# Patient Record
Sex: Male | Born: 1960 | Race: Asian | Hispanic: No | Marital: Married | Smoking: Former smoker
Health system: Southern US, Community
[De-identification: ages and names within clinical notes are randomized; demographics above are authoritative.]

## PROBLEM LIST (undated history)

## (undated) DIAGNOSIS — C801 Malignant (primary) neoplasm, unspecified: Secondary | ICD-10-CM

## (undated) HISTORY — PX: UPPER GI ENDOSCOPY: SHX6162

---

## 2008-10-21 ENCOUNTER — Emergency Department (HOSPITAL_COMMUNITY): Admission: EM | Admit: 2008-10-21 | Discharge: 2008-10-21 | Payer: Self-pay | Admitting: Emergency Medicine

## 2010-11-28 LAB — CBC
HCT: 42.1 % (ref 39.0–52.0)
Hemoglobin: 14.5 g/dL (ref 13.0–17.0)
MCHC: 34.4 g/dL (ref 30.0–36.0)
MCV: 101.6 fL — ABNORMAL HIGH (ref 78.0–100.0)
Platelets: 386 K/uL (ref 150–400)
RBC: 4.15 MIL/uL — ABNORMAL LOW (ref 4.22–5.81)
RDW: 13.2 % (ref 11.5–15.5)
WBC: 16.5 K/uL — ABNORMAL HIGH (ref 4.0–10.5)

## 2010-11-28 LAB — DIFFERENTIAL
Basophils Absolute: 0 10*3/uL (ref 0.0–0.1)
Eosinophils Absolute: 0.1 10*3/uL (ref 0.0–0.7)
Eosinophils Relative: 1 % (ref 0–5)
Lymphocytes Relative: 12 % (ref 12–46)
Neutrophils Relative %: 85 % — ABNORMAL HIGH (ref 43–77)

## 2010-11-28 LAB — URINALYSIS, ROUTINE W REFLEX MICROSCOPIC
Bilirubin Urine: NEGATIVE
Glucose, UA: NEGATIVE mg/dL
Ketones, ur: NEGATIVE mg/dL
Leukocytes, UA: NEGATIVE
Nitrite: NEGATIVE
Protein, ur: NEGATIVE mg/dL
Specific Gravity, Urine: 1.012 (ref 1.005–1.030)
Urobilinogen, UA: 0.2 mg/dL (ref 0.0–1.0)
pH: 7 (ref 5.0–8.0)

## 2010-11-28 LAB — URINE MICROSCOPIC-ADD ON

## 2010-11-28 LAB — BASIC METABOLIC PANEL
BUN: 10 mg/dL (ref 6–23)
Creatinine, Ser: 0.99 mg/dL (ref 0.4–1.5)
GFR calc non Af Amer: >60 mL/min (ref >60)
Glucose, Bld: 117 mg/dL — ABNORMAL HIGH (ref 70–99)
Potassium: 4 mEq/L (ref 3.5–5.1)

## 2012-11-22 ENCOUNTER — Ambulatory Visit: Payer: BC Managed Care – PPO | Admitting: Family Medicine

## 2012-11-22 VITALS — BP 108/72 | HR 78 | Temp 98.0°F | Resp 16 | Ht 63.5 in | Wt 105.0 lb

## 2012-11-22 DIAGNOSIS — R1013 Epigastric pain: Secondary | ICD-10-CM

## 2012-11-22 DIAGNOSIS — K2970 Gastritis, unspecified, without bleeding: Secondary | ICD-10-CM

## 2012-11-22 DIAGNOSIS — R109 Unspecified abdominal pain: Secondary | ICD-10-CM

## 2012-11-22 DIAGNOSIS — K265 Chronic or unspecified duodenal ulcer with perforation: Secondary | ICD-10-CM

## 2012-11-22 MED ORDER — ESOMEPRAZOLE MAGNESIUM 40 MG PO CPDR: 40.0000 mg | DELAYED_RELEASE_CAPSULE | Freq: Every day | ORAL | Status: DC

## 2012-11-22 NOTE — Patient Instructions (Addendum)
Schedule an appointment or come back to our walk-in clinic for your full physical and we will do all of your blood work and see if the medication is working.  Please fast at that visit.  Gastritis, Adult Gastritis is soreness and swelling (inflammation) of the lining of the stomach. Gastritis can develop as a sudden onset (acute) or long-term (chronic) condition. If gastritis is not treated, it can lead to stomach bleeding and ulcers. CAUSES  Gastritis occurs when the stomach lining is weak or damaged. Digestive juices from the stomach then inflame the weakened stomach lining. The stomach lining may be weak or damaged due to viral or bacterial infections. One common bacterial infection is the Helicobacter pylori infection. Gastritis can also result from excessive alcohol consumption, taking certain medicines, or having too much acid in the stomach.  SYMPTOMS  In some cases, there are no symptoms. When symptoms are present, they may include:  Pain or a burning sensation in the upper abdomen.  Nausea.  Vomiting.  An uncomfortable feeling of fullness after eating. DIAGNOSIS  Your caregiver may suspect you have gastritis based on your symptoms and a physical exam. To determine the cause of your gastritis, your caregiver may perform the following:  Blood or stool tests to check for the H pylori bacterium.  Gastroscopy. A thin, flexible tube (endoscope) is passed down the esophagus and into the stomach. The endoscope has a light and camera on the end. Your caregiver uses the endoscope to view the inside of the stomach.  Taking a tissue sample (biopsy) from the stomach to examine under a microscope. TREATMENT  Depending on the cause of your gastritis, medicines may be prescribed. If you have a bacterial infection, such as an H pylori infection, antibiotics may be given. If your gastritis is caused by too much acid in the stomach, H2 blockers or antacids may be given. Your caregiver may recommend  that you stop taking aspirin, ibuprofen, or other nonsteroidal anti-inflammatory drugs (NSAIDs). HOME CARE INSTRUCTIONS  Only take over-the-counter or prescription medicines as directed by your caregiver.  If you were given antibiotic medicines, take them as directed. Finish them even if you start to feel better.  Drink enough fluids to keep your urine clear or pale yellow.  Avoid foods and drinks that make your symptoms worse, such as:  Caffeine or alcoholic drinks.  Chocolate.  Peppermint or mint flavorings.  Garlic and onions.  Spicy foods.  Citrus fruits, such as oranges, lemons, or limes.  Tomato-based foods such as sauce, chili, salsa, and pizza.  Fried and fatty foods.  Eat small, frequent meals instead of large meals. SEEK IMMEDIATE MEDICAL CARE IF:   You have black or dark red stools.  You vomit blood or material that looks like coffee grounds.  You are unable to keep fluids down.  Your abdominal pain gets worse.  You have a fever.  You do not feel better after 1 week.  You have any other questions or concerns. MAKE SURE YOU:  Understand these instructions.  Will watch your condition.  Will get help right away if you are not doing well or get worse. Document Released: 07/29/2001 Document Revised: 02/03/2012 Document Reviewed: 09/17/2011 [facility] Patient Information 2013 [place], [place].

## 2012-11-22 NOTE — Progress Notes (Signed)
  Subjective:    Patient ID: Evan Peterson, male    DOB: Jan 08, 1961, 52 y.o.   MRN: [id number]  HPI  Has been having epigastric pain for the past several months with cramping. Does have bowel movements 1-3x daily but normal consistency.  Normal urination.  Has tried some otc meds - several types and some helped but doesn't remember what it was. Has tried zantac and pepcid with some minimal relief. No burping, occ flatulence.  Starts often when he is starting to get hungry and goes away with food.  Sometimes worse with spicy food.  Does not know about greasy foods. No melena or blood in stools. No n/v.  History reviewed. No pertinent past medical history.  No current outpatient prescriptions on file prior to visit.   No current facility-administered medications on file prior to visit.    Allergies  Allergen Reactions  . Amoxicillin   . Asa (Aspirin) Nausea And Vomiting   .  Review of Systems  Constitutional: Positive for appetite change. Negative for fever, chills, diaphoresis and activity change.  Respiratory: Negative for shortness of breath.   Cardiovascular: Negative for chest pain.  Gastrointestinal: Positive for abdominal pain. Negative for nausea, vomiting, diarrhea, constipation, blood in stool, abdominal distention, anal bleeding and rectal pain.  Genitourinary: Negative for dysuria, frequency and decreased urine volume.  Hematological: Negative for adenopathy.      BP 108/72  Pulse 78  Temp(Src) 98 F (36.7 C) (Oral)  Resp 16  Ht 5' 3.5" (1.613 m)  Wt 105 lb (47.628 kg)  BMI 18.31 kg/m2  SpO2 97% Objective:   Physical Exam  Constitutional: He appears well-developed and well-nourished. No distress.  HENT:  Head: Normocephalic and atraumatic.  Neck: Normal range of motion. Neck supple. No thyromegaly present.  Cardiovascular: Normal rate, regular rhythm and normal heart sounds.   Pulmonary/Chest: Effort normal and breath sounds normal.  Abdominal: Soft. Normal  appearance and bowel sounds are normal. He exhibits no distension and no mass. There is no tenderness. There is no rebound, no guarding and no CVA tenderness. No hernia.  Genitourinary: Rectum normal and prostate normal. Rectal exam shows no tenderness and anal tone normal. Guaiac negative stool.  Lymphadenopathy:    He has no cervical adenopathy.  Skin: He is not diaphoretic.          Assessment & Plan:  Abdominal pain - Plan: CANCELED: POCT CBC, CANCELED: Comprehensive metabolic panel, CANCELED: H. pylori antibody, IgG  Abdominal pain, chronic, epigastric  Gastritis  Pt declines labs today. He would like to try the rx nexium first to see if it works and then he will RTC for a full CPE - ok to sched at 104 if pt prefers - and do labs at that time. If he is still having any abd sxs, cons doing h. pylori at f/u.

## 2013-02-22 ENCOUNTER — Telehealth: Payer: Self-pay | Admitting: Family Medicine

## 2013-02-22 NOTE — Telephone Encounter (Signed)
Nexium is not really helping so wanted to change to a different medication. Please advise  [facility] [place] [phone]

## 2013-02-23 NOTE — Telephone Encounter (Signed)
Called patient to advise. Voice mail not set up yet. 

## 2013-02-23 NOTE — Telephone Encounter (Signed)
Per my note in April "Pt declines labs today. He would like to try the rx nexium first to see if it works and then he will RTC for a full CPE - ok to sched at 104 if pt prefers - and do labs at that time. If he is still having any abd sxs, he will need a h. pylori test at f/u." So he need to come in for labs.  If he wants to do a full CPE at the same time so he only has to do blood work once, that is fine but may have 2 co-pays (problem visit and CPE visit). If in for CPE visit - needs to be fasting.

## 2013-02-23 NOTE — Telephone Encounter (Signed)
Patient called back, declined appt. States waste of money to come in for labs, explained again. He states he will follow up with family dr.

## 2013-06-16 ENCOUNTER — Other Ambulatory Visit: Payer: Self-pay | Admitting: Gastroenterology

## 2013-06-16 DIAGNOSIS — R1012 Left upper quadrant pain: Secondary | ICD-10-CM

## 2013-06-17 ENCOUNTER — Ambulatory Visit
Admission: RE | Admit: 2013-06-17 | Discharge: 2013-06-17 | Disposition: A | Discharge status: Home / Self Care | Payer: Self-pay | Source: Ambulatory Visit | Attending: Gastroenterology | Admitting: Gastroenterology

## 2013-06-17 DIAGNOSIS — R1012 Left upper quadrant pain: Secondary | ICD-10-CM

## 2013-07-12 ENCOUNTER — Other Ambulatory Visit: Payer: Self-pay | Admitting: Gastroenterology

## 2013-07-18 ENCOUNTER — Encounter (HOSPITAL_COMMUNITY): Payer: Self-pay | Admitting: Emergency Medicine

## 2013-07-18 ENCOUNTER — Telehealth: Payer: Self-pay | Admitting: *Deleted

## 2013-07-18 ENCOUNTER — Emergency Department (HOSPITAL_COMMUNITY)
Admission: EM | Admit: 2013-07-18 | Discharge: 2013-07-18 | Disposition: A | Discharge status: Home / Self Care | Payer: BC Managed Care – PPO | Attending: Emergency Medicine | Admitting: Emergency Medicine

## 2013-07-18 ENCOUNTER — Emergency Department (HOSPITAL_COMMUNITY): Payer: BC Managed Care – PPO

## 2013-07-18 DIAGNOSIS — R1013 Epigastric pain: Secondary | ICD-10-CM | POA: Insufficient documentation

## 2013-07-18 DIAGNOSIS — R112 Nausea with vomiting, unspecified: Secondary | ICD-10-CM

## 2013-07-18 DIAGNOSIS — R109 Unspecified abdominal pain: Secondary | ICD-10-CM

## 2013-07-18 DIAGNOSIS — Z79899 Other long term (current) drug therapy: Secondary | ICD-10-CM | POA: Insufficient documentation

## 2013-07-18 DIAGNOSIS — Z792 Long term (current) use of antibiotics: Secondary | ICD-10-CM | POA: Insufficient documentation

## 2013-07-18 DIAGNOSIS — F17200 Nicotine dependence, unspecified, uncomplicated: Secondary | ICD-10-CM | POA: Insufficient documentation

## 2013-07-18 DIAGNOSIS — C169 Malignant neoplasm of stomach, unspecified: Secondary | ICD-10-CM | POA: Insufficient documentation

## 2013-07-18 HISTORY — DX: Malignant (primary) neoplasm, unspecified: C80.1

## 2013-07-18 LAB — CBC WITH DIFFERENTIAL/PLATELET
Basophils Absolute: 0 10*3/uL (ref 0.0–0.1)
Basophils Relative: 0 % (ref 0–1)
Eosinophils Absolute: 0.7 10*3/uL (ref 0.0–0.7)
Eosinophils Relative: 7 % — ABNORMAL HIGH (ref 0–5)
HCT: 42.6 % (ref 39.0–52.0)
Hemoglobin: 15.3 g/dL (ref 13.0–17.0)
Lymphocytes Relative: 32 % (ref 12–46)
Lymphs Abs: 3.2 10*3/uL (ref 0.7–4.0)
MCH: 34.1 pg — ABNORMAL HIGH (ref 26.0–34.0)
MCHC: 35.9 g/dL (ref 30.0–36.0)
MCV: 94.9 fL (ref 78.0–100.0)
Monocytes Absolute: 0.7 10*3/uL (ref 0.1–1.0)
Monocytes Relative: 8 % (ref 3–12)
Neutro Abs: 5.2 10*3/uL (ref 1.7–7.7)
Neutrophils Relative %: 53 % (ref 43–77)
Platelets: 477 10*3/uL — ABNORMAL HIGH (ref 150–400)
RBC: 4.49 MIL/uL (ref 4.22–5.81)
RDW: 13 % (ref 11.5–15.5)
WBC: 9.8 10*3/uL (ref 4.0–10.5)

## 2013-07-18 LAB — URINALYSIS, ROUTINE W REFLEX MICROSCOPIC
Glucose, UA: NEGATIVE mg/dL
Leukocytes, UA: NEGATIVE
Protein, ur: NEGATIVE mg/dL
Urobilinogen, UA: 0.2 mg/dL (ref 0.0–1.0)

## 2013-07-18 LAB — LIPASE, BLOOD: Lipase: 33 U/L (ref 11–59)

## 2013-07-18 LAB — COMPREHENSIVE METABOLIC PANEL
Albumin: 3.6 g/dL (ref 3.5–5.2)
BUN: 10 mg/dL (ref 6–23)
Chloride: 99 mEq/L (ref 96–112)
Creatinine, Ser: 0.7 mg/dL (ref 0.50–1.35)
Total Bilirubin: 0.2 mg/dL — ABNORMAL LOW (ref 0.3–1.2)

## 2013-07-18 LAB — URINE MICROSCOPIC-ADD ON

## 2013-07-18 MED ORDER — HYDROMORPHONE HCL PF 1 MG/ML IJ SOLN
1.0000 mg | Freq: Once | INTRAMUSCULAR | Status: AC
  Administered 2013-07-18: 1 mg via INTRAVENOUS
  Filled 2013-07-18: qty 1

## 2013-07-18 MED ORDER — ONDANSETRON HCL 4 MG/2ML IJ SOLN
4.0000 mg | Freq: Once | INTRAMUSCULAR | Status: AC
  Administered 2013-07-18: 4 mg via INTRAVENOUS
  Filled 2013-07-18: qty 2

## 2013-07-18 MED ORDER — SODIUM CHLORIDE 0.9 % IV BOLUS (SEPSIS)
1000.0000 mL | Freq: Once | INTRAVENOUS | Status: AC
  Administered 2013-07-18: 1000 mL via INTRAVENOUS

## 2013-07-18 MED ORDER — ONDANSETRON HCL 4 MG PO TABS: 4.0000 mg | ORAL_TABLET | Freq: Four times a day (QID) | ORAL | Status: DC

## 2013-07-18 MED ORDER — OXYCODONE-ACETAMINOPHEN 5-325 MG PO TABS: 1.0000 | ORAL_TABLET | Freq: Four times a day (QID) | ORAL | Status: DC | PRN

## 2013-07-18 MED ORDER — IOHEXOL 300 MG/ML  SOLN: 50.0000 mL | Freq: Once | INTRAMUSCULAR | Status: DC | PRN

## 2013-07-18 NOTE — Telephone Encounter (Signed)
Received referral from Dr. Bosie Clos on 07/15/13.  Information was reviewed by Dr. Truett Perna.  Per Dr. Truett Perna, needs to see Dr. Donell Beers first.  This RN left message with Dr. Arita Miss nurse to see if patient can be seen by Dr.Byerly this week.  This RN spoke with Dr. Marge Duncans nurse, Efraim Kaufmann, and notified her of above information.  Efraim Kaufmann is also going to contact Dr.Byerly's office and will fax the patient's information.

## 2013-07-18 NOTE — ED Notes (Signed)
Pt sent from [facility], Dr Bosie Clos. Pt had endoscopy last week, dx with stomach cancer. Suppose to see surgeon Dr Donell Beers surgeon on Friday. Pt reported pain is worse, vomits food, but can keep fluids down.

## 2013-07-18 NOTE — Telephone Encounter (Signed)
Received phone call from Dr. Marge Duncans nurse, Efraim Kaufmann.  She stated that she called patient to give appointment with Dr. Donell Beers for 07/22/13.  She said the patient stated he cannot wait until Friday due to having difficulty eating, having pain, and throwing up.  She stated patient was instructed to go to the ED  ASAP per Dr. Bosie Clos.   Dr. Truett Perna  And Dr. Donell Beers informed of above.

## 2013-07-18 NOTE — ED Provider Notes (Signed)
CSN: [id number]     Arrival date & time 07/18/13  1322 History   First MD Initiated Contact with Patient 07/18/13 1536     Chief Complaint  Patient presents with  . stomach cancer, sent from [facility]    (Consider location/radiation/quality/duration/timing/severity/associated sxs/prior Treatment) HPI  This a 52 year old male with stomach cancer who presents with nausea, vomiting, inability to tolerate by mouth, and abdominal pain.  Patient had an endoscopy last week and was diagnosed with cancer. He is scheduled to see Dr. Donell Beers on Friday. Patient reports a 2 to three-week history of nausea, vomiting, and inability to tolerate by mouth. He scrapped nonbilious, nonbloody emesis. He also reports sharp epigastric pain that is nonradiating. Currently his pain is 9/10. He reports that his pain medications at home are not helping. He states he feels fatigued and generally weak. He denies any focal weakness or numbness. He denies any fevers.  Past Medical History  Diagnosis Date  . Cancer     stomach   Past Surgical History  Procedure Laterality Date  . Upper gi endoscopy     History reviewed. No pertinent family history. History  Substance Use Topics  . Smoking status: Current Every Day Smoker  . Smokeless tobacco: Not on file  . Alcohol Use: No    Review of Systems  Constitutional: Negative.  Negative for fever.  Respiratory: Negative.  Negative for chest tightness and shortness of breath.   Cardiovascular: Negative.  Negative for chest pain.  Gastrointestinal: Positive for nausea, vomiting and abdominal pain. Negative for diarrhea.  Genitourinary: Negative.  Negative for dysuria.  Musculoskeletal: Negative for back pain.  Neurological: Negative for headaches.  All other systems reviewed and are negative.    Allergies  Asa and Clarithromycin  Home Medications   Current Outpatient Rx  Name  Route  Sig  Dispense  Refill  . amoxicillin (AMOXIL) 500 MG capsule   Oral   Take  1,000 mg by mouth 2 (two) times daily.         Marland Kitchen omeprazole (PRILOSEC) 20 MG capsule   Oral   Take 20 mg by mouth daily.         . ondansetron (ZOFRAN) 4 MG tablet   Oral   Take 1 tablet (4 mg total) by mouth every 6 (six) hours.   12 tablet   0   . oxyCODONE-acetaminophen (PERCOCET/ROXICET) 5-325 MG per tablet   Oral   Take 1-2 tablets by mouth every 6 (six) hours as needed for severe pain.   20 tablet   0    BP 110/71  Pulse 82  Temp(Src) 97.7 F (36.5 C) (Oral)  Resp 18  SpO2 96% Physical Exam  Nursing note and vitals reviewed. Constitutional: He is oriented to person, place, and time.  Ill-appearing but nontoxic  HENT:  Head: Normocephalic and atraumatic.  Mucous membranes dry  Eyes: Pupils are equal, round, and reactive to light.  Neck: Neck supple.  Cardiovascular: Normal rate, regular rhythm and normal heart sounds.   No murmur heard. Pulmonary/Chest: Effort normal and breath sounds normal. No respiratory distress.  Abdominal: Soft. Bowel sounds are normal. He exhibits no distension. There is tenderness. There is no rebound.  Tenderness palpation of the epigastrium without rebound or guarding  Musculoskeletal: He exhibits no edema.  Lymphadenopathy:    He has no cervical adenopathy.  Neurological: He is alert and oriented to person, place, and time.  Skin: Skin is warm and dry.  Psychiatric: He has a normal  mood and affect.    ED Course  Procedures (including critical care time) Labs Review Labs Reviewed  CBC WITH DIFFERENTIAL - Abnormal; Notable for the following:    MCH 34.1 (*)    Platelets 477 (*)    Eosinophils Relative 7 (*)    All other components within normal limits  COMPREHENSIVE METABOLIC PANEL - Abnormal; Notable for the following:    Sodium 133 (*)    Glucose, Bld 100 (*)    Total Bilirubin 0.2 (*)    All other components within normal limits  URINALYSIS, ROUTINE W REFLEX MICROSCOPIC - Abnormal; Notable for the following:    Hgb  urine dipstick TRACE (*)    All other components within normal limits  URINE MICROSCOPIC-ADD ON - Abnormal; Notable for the following:    Bacteria, UA FEW (*)    All other components within normal limits  LIPASE, BLOOD  CG4 I-STAT (LACTIC ACID)   Imaging Review Ct Abdomen Pelvis Wo Contrast  07/18/2013   CLINICAL DATA:  Known gastric carcinoma recently diagnosed, with worsening pain, vomiting food  EXAM: CT ABDOMEN AND PELVIS WITHOUT CONTRAST  TECHNIQUE: Multidetector CT imaging of the abdomen and pelvis was performed following the standard protocol without intravenous contrast.  COMPARISON:  06/17/2013  FINDINGS: 2 mm stone midpole right kidney again identified, not causing obstruction. Left kidney is normal. Pancreas and spleen are grossly negative, as is the liver and as is the gallbladder, and there non contrasted states. Adrenal glands are negative. There is calcification of the aorta. The bladder is normal. Reproductive organs are normal.  The mid to distal body of the stomach and the gastric antrum show wall thickening. There is a small volume of oral contrast on this study, mostly concentrated an distal small bowel. There is no evidence of significant bowel dilatation. The colon is decompressed and without contrast, and it is not well evaluated. There is no free air or free fluid in the abdomen or pelvis.  The visualized portions of the lung bases are clear. There are no acute musculoskeletal findings.  IMPRESSION: Limited noncontrasted study again demonstrates moderate wall thickening of the distal stomach, consistent with history of gastric carcinoma. There is a nonobstructive bowel gas pattern. Bowel is not evaluated in detail given the limited volume of oral contrast present for this examination.   Electronically Signed   By: Esperanza Heir M.D.   On: 07/18/2013 19:23    EKG Interpretation   None       MDM   1. Nausea & vomiting   2. Abdominal pain    Patient presents with  nausea, vomiting, and abdominal pain in the setting of gastric cancer. He is nontoxic-appearing. He does appear dehydrated. Patient was given fluids, pain medication, and nausea medication. Workup is largely unremarkable. Patient had improvement of his symptoms. He was encouraged to continue followup with his GI doctor, oncologist, and Dr. Donell Beers on Friday.  After history, exam, and medical workup I feel the patient has been appropriately medically screened and is safe for discharge home. Pertinent diagnoses were discussed with the patient. Patient was given return precautions.     Shon Baton, MD 07/19/13 [id number]

## 2013-07-18 NOTE — Progress Notes (Signed)
   CARE MANAGEMENT ED NOTE 07/18/2013  Patient:  Evan Peterson,Evan Peterson   Account Number:  [id number]  Date Initiated:  07/18/2013  Documentation initiated by:  Edd Arbour  Subjective/Objective Assessment:   52 yr old male [facility] [place]cp listed in EPIC     Subjective/Objective Assessment Detail:   pt states pcp is tammy boyd     Action/Plan:   spoke with pt and male at bedside   Action/Plan Detail:   Anticipated DC Date:       Status Recommendation to Physician:   Result of Recommendation:    Other ED Services  Consult Working Psychologist, educational  Other  Outpatient Services - Pt will follow up  PCP issues    Choice offered to / List presented to:            Status of service:  Completed, signed off  ED Comments:   ED Comments Detail:

## 2013-07-22 ENCOUNTER — Encounter (INDEPENDENT_AMBULATORY_CARE_PROVIDER_SITE_OTHER): Payer: Self-pay | Admitting: General Surgery

## 2013-07-22 ENCOUNTER — Other Ambulatory Visit (INDEPENDENT_AMBULATORY_CARE_PROVIDER_SITE_OTHER): Payer: Self-pay | Admitting: General Surgery

## 2013-07-22 ENCOUNTER — Other Ambulatory Visit (INDEPENDENT_AMBULATORY_CARE_PROVIDER_SITE_OTHER): Payer: Self-pay

## 2013-07-22 ENCOUNTER — Ambulatory Visit (INDEPENDENT_AMBULATORY_CARE_PROVIDER_SITE_OTHER): Payer: BC Managed Care – PPO | Admitting: General Surgery

## 2013-07-22 VITALS — BP 118/70 | HR 80 | Temp 98.1°F | Resp 14 | Ht 63.0 in | Wt 106.0 lb

## 2013-07-22 DIAGNOSIS — C169 Malignant neoplasm of stomach, unspecified: Secondary | ICD-10-CM

## 2013-07-22 MED ORDER — OXYCODONE-ACETAMINOPHEN 5-325 MG PO TABS: 1.0000 | ORAL_TABLET | ORAL | Status: DC | PRN

## 2013-07-22 MED ORDER — TRAMADOL HCL 50 MG PO TABS: 50.0000 mg | ORAL_TABLET | Freq: Four times a day (QID) | ORAL | Status: DC | PRN

## 2013-07-22 MED ORDER — ONDANSETRON HCL 4 MG PO TABS: 4.0000 mg | ORAL_TABLET | Freq: Four times a day (QID) | ORAL | Status: DC

## 2013-07-22 NOTE — Patient Instructions (Signed)
We are ordering PET scan.  This is more valuable to look at the lungs and liver since you have not gotten contrast.    This can take a week or two to schedule.  Once I hear from them, I will schedule surgery.    You will need to see nutrition at the cancer center.    If we find evidence of cancer outside of the stomach region, we will not do surgery to remove the cancer.  We may still need to do feeding tube and/or port a cath which is used for chemotherapy.      Antrectomy, Partial, or Subtotal Gastrectomy An antrectomy, partial, or subtotal gastrectomy is the surgical removal of part of the stomach. It is performed to treat cancer of the stomach, ulcer disease, obstruction, or injury (trauma). LET YOUR CAREGIVER KNOW ABOUT:   Allergies to food or medicine.  Medicines taken, including vitamins, herbs, eyedrops, over-the-counter medicines, and creams.  Use of steroids (by mouth or creams).  Previous problems with anesthetics or numbing medicines.  History of bleeding problems or blood clots.  Previous surgery.  Other health problems, including diabetes and kidney problems.  Possibility of pregnancy, if this applies. RISKS AND COMPLICATIONS  You will be monitored closely for complications during surgery and recovery. Many complications can be treated. Complications may include:  Bleeding.  Infection. Leak of stomach connection  Prolonged nausea or vomiting  Feeding tube issues.    Reaction to anesthesia.  Damage to other organs or tissue.  Hernia.  Blood clot. BEFORE THE PROCEDURE  Before your procedure, you may have:  A physical exam, blood tests, stool test, X-rays, and other procedures.  Chemotherapy or radiation therapy.  Your caregiver review with you the procedure, the anesthesia being used, and what to expect after the procedure. You may be asked to:  Stop taking certain medicines for several days prior to your procedure, such as blood thinners  (including aspirin).  Take certain medicines, such as antibiotics or stool softeners.  Follow a special diet for several days prior to the procedure.  Avoid eating and drinking after midnight the night before the procedure. This will help you to avoid complications from the anesthesia.  Take an antibacterial shower the night before or the morning of the procedure. Arrange for a ride home after surgery and to ask someone to help you with activities during recovery. PROCEDURE   You will be given a medicine to make you sleep (general anesthesia) during the procedure.  The procedure takes a few to several hours to complete. You will not feel any pain.  Antrectomy, partial, or subtotal gastrectomy can be performed as an open procedure or a laparoscopic procedure.  During an open procedure, the surgeon will make a cut (incision) in the abdominal wall to access the stomach.  During a laparoscopic procedure, small incisions are made in the abdominal wall. Small, lighted tubes (laparoscopes) with instruments are inserted into the surgical site.  Sometimes, a combination procedure is performed using both of these techniques.  The surgeon will remove a part of the stomach and connect the remaining stomach to the small intestine. Depending on your condition, other parts (spleen, pancreas, lymph nodes) may be removed during surgery as well. AFTER THE PROCEDURE   You will be monitored closely in a recovery room.  You will need to stay in the hospital for up to a week or longer, depending on your condition.  You will be given medicine for pain and nutrition through an  intravenous (IV) access.  If you have a tube in the nose (nasogastric, NG tube) to remove fluids, it will be removed after 2 to 3 days. MAKE SURE YOU:   Understand these instructions.  Will watch your condition.  Will get help right away if you are not doing well or get worse. D

## 2013-07-22 NOTE — Progress Notes (Signed)
Chief complaint:  New diagnosis of gastric cancer  HISTORY: Patient is a 52-year-old male who presents with approximately one year of abdominal pain. He has had progressively decreasing appetite over this time and has started to have nausea and vomiting. He sometimes will throw up in the morning what he ate the night before. He denies any bloody stools. He has lost approximately 10 pounds. He is from [place] but has been here for 30 years. He also has had some issues with diarrhea and some concentrated urine. He was evaluated by Dr. Schooler and was found on endoscopy to have a mass on the lesser curvature of the stomach. 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 He has had a noncontrasted CT scan which is negative for metastatic disease.  He was taking NSAIDs previously but stopped these around 9 months ago. He did not have any improvement in symptoms since then. Since his diagnosis, he has been taking Percocet at night. 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 He continues to feel weak because of his difficulty with his diet.  Past Medical History  Diagnosis Date  . Cancer     stomach    Past Surgical History  Procedure Laterality Date  . Upper gi endoscopy      Current Outpatient Prescriptions  Medication Sig Dispense Refill  . omeprazole (PRILOSEC) 20 MG capsule Take 20 mg by mouth daily.      . ondansetron (ZOFRAN) 4 MG tablet Take 1 tablet (4 mg total) by mouth every 6 (six) hours.  12 tablet  0  . oxyCODONE-acetaminophen (PERCOCET/ROXICET) 5-325 MG per tablet Take 1-2 tablets by mouth every 6 (six) hours as needed for severe pain.  20 tablet  0   No current facility-administered medications for this visit.     Allergies  Allergen Reactions  . Asa [Aspirin] Nausea And Vomiting  . Clarithromycin Other (See Comments)    More pain and nausea     No family history on file.   History   Social History  . Marital Status: Married    Spouse  Name: N/A    Number of Children: N/A  . Years of Education: N/A   Social History Main Topics  . Smoking status: Current Every Day Smoker  . Smokeless tobacco: None  . Alcohol Use: No  . Drug Use: No  . Sexual Activity: None   Other Topics Concern  . None   Social History Narrative  . None     REVIEW OF SYSTEMS - PERTINENT POSITIVES ONLY: 12 point review of systems negative other than HPI and PMH except for chills, cough, abdominal pain, nausea, vomiting, diarrhea, difficulty urinating, and weakness.  EXAM: Filed Vitals:   07/22/13 0843  BP: 118/70  Pulse: 80  Temp: 98.1 F (36.7 C)  Resp: 14   Filed Weights   07/22/13 0843  Weight: 106 lb (48.081 kg)    Gen:  No acute distress.  Very thin, cachectic and well groomed.   Neurological: Alert and oriented to person, place, and time. Coordination normal.  Head: Normocephalic and atraumatic.  Eyes: Conjunctivae are normal. Pupils are equal, round, and reactive to light. No scleral icterus.  Neck: Normal range of motion. Neck supple. No tracheal deviation or thyromegaly present.  Cardiovascular: Normal rate, regular rhythm, normal heart sounds and intact distal pulses.  Exam reveals no gallop and no friction rub.  No murmur heard. Respiratory: Effort normal.  No respiratory distress. No chest wall tenderness. Breath sounds   normal.  No wheezes, rales or rhonchi.  GI: Soft. Bowel sounds are normal. The abdomen is soft and nontender.  There is no rebound and no guarding.  Musculoskeletal: Normal range of motion. Extremities are nontender.  Lymphadenopathy: No cervical, preauricular, postauricular or axillary adenopathy is present Skin: Skin is warm and dry. No rash noted. No diaphoresis. No erythema. No pallor. No clubbing, cyanosis, or edema.   Psychiatric: Normal mood and affect. Behavior is normal. Judgment and thought content normal.    LABORATORY RESULTS: Available labs are reviewed  CMET essentially normal. Albumin  3.6. CBC essentially normal.   RADIOLOGY RESULTS: See E-Chart or I-Site for most recent results.  Images and reports are reviewed. Non contrasted CT abd/pelvis IMPRESSION:  Limited noncontrasted study again demonstrates moderate wall  thickening of the distal stomach, consistent with history of gastric  carcinoma. There is a nonobstructive bowel gas pattern. Bowel is not  evaluated in detail given the limited volume of oral contrast  present for this examination.    ASSESSMENT AND PLAN: Gastric cancer Patient appears to have localized distal gastric cancer. However, he has not had any chest imaging and has also not had any contrasted abdominal imaging.  The patient thinks that he has an allergy to IV contrast. I cannot find documentation of that, but the patient states that it makes him feel like his heart is going to stop.  After discussion with Dr. Veazey in radiology, I'm ordering a PET scan to get the most information regarding the liver and the lungs.  He has lost weight, but his albumin is 3.6.  As long as there is no evidence of metastatic disease, my plan would be to do a diagnostic laparoscopy, probable distal gastrectomy and feeding tube.  I discussed the risk of surgery including bleeding, infection, leak of surgical connections, feeding tube complications, postoperative restrictions, prolonged nausea and vomiting, cardiac and pulmonary complications, and risks of finding metastatic disease.  I discussed that I would not resect the primary cancer if we found metastatic disease on the PET scan or at the time of diagnostic laparoscopy.     40 min spent in evaluation, examination, counseling, and coordination of care.    Millette Halberstam L Kynlea Blackston MD Surgical Oncology, General and Endocrine Surgery [facility]      Visit Diagnoses: 1. Gastric cancer     Primary Care Physician: Boyd, Tammy Lamonica, MD  Schooler, Vince.    

## 2013-07-22 NOTE — Assessment & Plan Note (Signed)
Patient appears to have localized distal gastric cancer. However, he has not had any chest imaging and has also not had any contrasted abdominal imaging.  The patient thinks that he has an allergy to IV contrast. I cannot find documentation of that, but the patient states that it makes him feel like his heart is going to stop.  After discussion with Dr. Purcell Mouton in radiology, I'm ordering a PET scan to get the most information regarding the liver and the lungs.  He has lost weight, but his albumin is 3.6.  As long as there is no evidence of metastatic disease, my plan would be to do a diagnostic laparoscopy, probable distal gastrectomy and feeding tube.  I discussed the risk of surgery including bleeding, infection, leak of surgical connections, feeding tube complications, postoperative restrictions, prolonged nausea and vomiting, cardiac and pulmonary complications, and risks of finding metastatic disease.  I discussed that I would not resect the primary cancer if we found metastatic disease on the PET scan or at the time of diagnostic laparoscopy.

## 2013-07-25 ENCOUNTER — Other Ambulatory Visit: Payer: Self-pay | Admitting: *Deleted

## 2013-07-25 ENCOUNTER — Telehealth (INDEPENDENT_AMBULATORY_CARE_PROVIDER_SITE_OTHER): Payer: Self-pay

## 2013-07-25 ENCOUNTER — Telehealth: Payer: Self-pay | Admitting: *Deleted

## 2013-07-25 DIAGNOSIS — C169 Malignant neoplasm of stomach, unspecified: Secondary | ICD-10-CM

## 2013-07-25 NOTE — Telephone Encounter (Signed)
per 12/8 POF appt made for 12/12 w Dietician Vernell Leep LVMM on pts home phone number giving date and time of appt shh

## 2013-07-25 NOTE — Telephone Encounter (Signed)
Message copied by Milas Hock on Mon Jul 25, 2013  5:05 PM ------      Message from: Isaias Sakai K      Created: Mon Jul 25, 2013 12:08 PM      Regarding: Dr Donell Beers      Contact: [phone]       Wants to know from the doctor if she feels that he can wait until the first of the year for sx. Sx scheduled for 08/05/13 ------

## 2013-07-25 NOTE — Telephone Encounter (Signed)
Is this feasible?

## 2013-07-26 ENCOUNTER — Encounter (INDEPENDENT_AMBULATORY_CARE_PROVIDER_SITE_OTHER): Payer: Self-pay

## 2013-07-27 NOTE — Progress Notes (Signed)
Surgery scheduled on 08/05/13.  Preop on 08/03/13 at 1000.  Need orders in EPIC. Thank You.

## 2013-07-29 ENCOUNTER — Ambulatory Visit: Payer: Medicaid Other | Admitting: Nutrition

## 2013-07-29 NOTE — Progress Notes (Signed)
Evan Peterson is a 52 year old male diagnosed with gastric cancer.  He is a Evan Peterson of Dr. Truett Perna.  Past medical history is not significant.  Medications include Zofran, Prilosec, Tramadol and oxycodone.  Labs include sodium of 133 and BUN of 100.  Height:  63 inches. Weight: 106 pounds. Usual body weight:  112-115 pounds. BMI: 18.78.  Evan Peterson presents to nutrition assessment with his wife.  Evan Peterson complains of severe pain.  He reports nausea and vomiting daily.  He is having difficulty tolerating any types of foods including liquids and bland foods.  Evan Peterson has not been taking pain medication or nausea medication consistently as prescriptions have been written.  Nutrition focused physical exam was deferred secondary to Evan Peterson's increased pain today.  Nutrition diagnosis: Unintended weight loss related to inadequate oral intake, nausea, and pain as evidenced by BMI of 18.78 and 5% weight loss from usual body weight.  Intervention: I educated Evan Peterson on the importance of taking medication as prescribed by physician.  I've encouraged Evan Peterson to consume small amounts of high-calorie, high-protein foods throughout the day, focusing on bland foods.  I provided samples of oral nutrition supplements that Evan Peterson may tolerate.  Recommend Evan Peterson begin with 2-4 ounces at a time and assess tolerance.  Evan Peterson directed to call physician/nurse for clarification on medications.  Monitoring, evaluation, goals: Evan Peterson will tolerate increased oral intake to minimize further weight loss.  It will be difficult to increase oral intake with continued pain and nausea.  Next visit: I will see Evan Peterson during treatment as needed.  He has my contact information for questions.

## 2013-08-01 NOTE — Progress Notes (Signed)
Need orders in epic pre op is 08-03-13 1000 am, thanks

## 2013-08-01 NOTE — Patient Instructions (Addendum)
19 Evan Peterson  08/01/2013   Your procedure is scheduled on: Friday December 19th  Report to [facility] at 530  AM.  Call this number if you have problems the morning of surgery [phone]   Remember:   Do not eat food or drink liquids :After Midnight.     Take these medicines the morning of surgery with A SIP OF WATER: no meds to take                                SEE [facility] PREPARING FOR SURGERY SHEET             You may not have any metal on your body including hair pins and piercings  Do not wear jewelry, make-up.  Do not wear lotions, powders, or perfumes. You may wear deodorant.   Men may shave face and neck.  Do not bring valuables to the hospital. [facility] IS NOT RESPONSIBLE FOR VALUEABLES.  Contacts, dentures or bridgework may not be worn into surgery.  Leave suitcase in the car. After surgery it may be brought to your room.  For patients admitted to the hospital, checkout time is 11:00 AM the day of discharge.   Patients discharged the day of surgery will not be allowed to drive home.  Name and phone number of your driver:  Special Instructions: N/A   Please read over the following fact sheets that you were given: blood sheet  Call Cain Sieve RN pre op nurse if needed [phone]    FAILURE TO FOLLOW THESE INSTRUCTIONS MAY RESULT IN THE CANCELLATION OF YOUR SURGERY.  PATIENT SIGNATURE___________________________________________  NURSE SIGNATURE_____________________________________________

## 2013-08-02 ENCOUNTER — Telehealth (INDEPENDENT_AMBULATORY_CARE_PROVIDER_SITE_OTHER): Payer: Self-pay | Admitting: *Deleted

## 2013-08-02 NOTE — Telephone Encounter (Signed)
I spoke with Malen Gauze with [employer] and received prior-authorization for PET scan scheduled at [facility] on 12/17 @ 1:00pm.  Auth#: [id number].

## 2013-08-03 ENCOUNTER — Encounter (HOSPITAL_COMMUNITY): Payer: Self-pay

## 2013-08-03 ENCOUNTER — Encounter (HOSPITAL_COMMUNITY)
Admission: RE | Admit: 2013-08-03 | Discharge: 2013-08-03 | Disposition: A | Discharge status: Home / Self Care | Payer: BC Managed Care – PPO | Source: Ambulatory Visit | Attending: General Surgery | Admitting: General Surgery

## 2013-08-03 ENCOUNTER — Other Ambulatory Visit (INDEPENDENT_AMBULATORY_CARE_PROVIDER_SITE_OTHER): Payer: Self-pay | Admitting: General Surgery

## 2013-08-03 DIAGNOSIS — C169 Malignant neoplasm of stomach, unspecified: Secondary | ICD-10-CM

## 2013-08-03 LAB — CBC WITH DIFFERENTIAL/PLATELET
Basophils Absolute: 0 10*3/uL (ref 0.0–0.1)
HCT: 43.7 % (ref 39.0–52.0)
Hemoglobin: 14.9 g/dL (ref 13.0–17.0)
Lymphocytes Relative: 26 % (ref 12–46)
Lymphs Abs: 2.8 10*3/uL (ref 0.7–4.0)
MCV: 97.1 fL (ref 78.0–100.0)
Monocytes Absolute: 0.6 10*3/uL (ref 0.1–1.0)
Neutro Abs: 6.8 10*3/uL (ref 1.7–7.7)
RBC: 4.5 MIL/uL (ref 4.22–5.81)
RDW: 13.4 % (ref 11.5–15.5)
WBC: 10.8 10*3/uL — ABNORMAL HIGH (ref 4.0–10.5)

## 2013-08-03 LAB — URINALYSIS, ROUTINE W REFLEX MICROSCOPIC
Bilirubin Urine: NEGATIVE
Glucose, UA: NEGATIVE mg/dL
Ketones, ur: NEGATIVE mg/dL
Protein, ur: NEGATIVE mg/dL
pH: 7 (ref 5.0–8.0)

## 2013-08-03 LAB — COMPREHENSIVE METABOLIC PANEL
ALT: 11 U/L (ref 0–53)
AST: 16 U/L (ref 0–37)
CO2: 30 mEq/L (ref 19–32)
Calcium: 9.7 mg/dL (ref 8.4–10.5)
Chloride: 97 mEq/L (ref 96–112)
Creatinine, Ser: 0.93 mg/dL (ref 0.50–1.35)
GFR calc Af Amer: >90 mL/min (ref >90)
GFR calc non Af Amer: >90 mL/min (ref >90)
Glucose, Bld: 135 mg/dL — ABNORMAL HIGH (ref 70–99)
Total Bilirubin: 0.4 mg/dL (ref 0.3–1.2)

## 2013-08-03 LAB — ABO/RH: ABO/RH(D): A POS

## 2013-08-03 LAB — PROTIME-INR: Prothrombin Time: 12.6 seconds (ref 11.6–15.2)

## 2013-08-03 LAB — APTT: aPTT: 31 seconds (ref 24–37)

## 2013-08-03 MED ORDER — FLUDEOXYGLUCOSE F - 18 (FDG) INJECTION
19.9000 | Freq: Once | INTRAVENOUS | Status: AC | PRN
  Administered 2013-08-03: 19.9 via INTRAVENOUS

## 2013-08-04 NOTE — Progress Notes (Signed)
Faxed pt request for vietnamese interpreter  Day of surgery to clinical social work 08-03-13, fax confirmation received. Left message on clinical social work voicemail to make sure fax received.

## 2013-08-04 NOTE — Progress Notes (Signed)
Spoke with [facility] clinical social work, no vietnamese interpreter available dor day of surgery 08-05-2013. Left patent message he could bring family member to help interpret or short stay can use language line 08-05-13.

## 2013-08-05 ENCOUNTER — Inpatient Hospital Stay (HOSPITAL_COMMUNITY)
Admission: RE | Admit: 2013-08-05 | Discharge: 2013-08-10 | DRG: 356 | Disposition: A | Discharge status: Home Health | Payer: BC Managed Care – PPO | Source: Ambulatory Visit | Attending: General Surgery | Admitting: General Surgery

## 2013-08-05 ENCOUNTER — Inpatient Hospital Stay (HOSPITAL_COMMUNITY): Payer: BC Managed Care – PPO | Admitting: Certified Registered Nurse Anesthetist

## 2013-08-05 ENCOUNTER — Encounter (HOSPITAL_COMMUNITY): Payer: BC Managed Care – PPO | Admitting: Certified Registered Nurse Anesthetist

## 2013-08-05 ENCOUNTER — Encounter (HOSPITAL_COMMUNITY)
Admission: RE | Disposition: A | Discharge status: Home Health | Payer: Self-pay | Source: Ambulatory Visit | Attending: General Surgery

## 2013-08-05 ENCOUNTER — Encounter (HOSPITAL_COMMUNITY): Payer: Self-pay | Admitting: *Deleted

## 2013-08-05 DIAGNOSIS — C169 Malignant neoplasm of stomach, unspecified: Secondary | ICD-10-CM

## 2013-08-05 DIAGNOSIS — C786 Secondary malignant neoplasm of retroperitoneum and peritoneum: Secondary | ICD-10-CM | POA: Diagnosis present

## 2013-08-05 DIAGNOSIS — R112 Nausea with vomiting, unspecified: Secondary | ICD-10-CM | POA: Diagnosis present

## 2013-08-05 DIAGNOSIS — C772 Secondary and unspecified malignant neoplasm of intra-abdominal lymph nodes: Secondary | ICD-10-CM

## 2013-08-05 DIAGNOSIS — Z934 Other artificial openings of gastrointestinal tract status: Secondary | ICD-10-CM

## 2013-08-05 DIAGNOSIS — R197 Diarrhea, unspecified: Secondary | ICD-10-CM | POA: Diagnosis present

## 2013-08-05 DIAGNOSIS — E43 Unspecified severe protein-calorie malnutrition: Secondary | ICD-10-CM | POA: Diagnosis present

## 2013-08-05 DIAGNOSIS — R109 Unspecified abdominal pain: Secondary | ICD-10-CM | POA: Diagnosis present

## 2013-08-05 DIAGNOSIS — R5381 Other malaise: Secondary | ICD-10-CM | POA: Diagnosis present

## 2013-08-05 DIAGNOSIS — Z681 Body mass index (BMI) 19 or less, adult: Secondary | ICD-10-CM

## 2013-08-05 DIAGNOSIS — C162 Malignant neoplasm of body of stomach: Principal | ICD-10-CM | POA: Diagnosis present

## 2013-08-05 DIAGNOSIS — F17200 Nicotine dependence, unspecified, uncomplicated: Secondary | ICD-10-CM | POA: Diagnosis present

## 2013-08-05 HISTORY — PX: PEG PLACEMENT: SHX5437

## 2013-08-05 HISTORY — PX: GASTRECTOMY: SHX58

## 2013-08-05 LAB — TYPE AND SCREEN
ABO/RH(D): A POS
Antibody Screen: NEGATIVE

## 2013-08-05 LAB — CREATININE, SERUM
GFR calc Af Amer: >90 mL/min (ref >90)
GFR calc non Af Amer: >90 mL/min (ref >90)

## 2013-08-05 LAB — CBC
HCT: 38.1 % — ABNORMAL LOW (ref 39.0–52.0)
Hemoglobin: 13.3 g/dL (ref 13.0–17.0)
MCV: 96.2 fL (ref 78.0–100.0)
Platelets: 472 10*3/uL — ABNORMAL HIGH (ref 150–400)
RDW: 13.3 % (ref 11.5–15.5)
WBC: 14 10*3/uL — ABNORMAL HIGH (ref 4.0–10.5)

## 2013-08-05 SURGERY — GASTRECTOMY, TOTAL
Anesthesia: General | Site: Abdomen

## 2013-08-05 MED ORDER — LIDOCAINE HCL (CARDIAC) 20 MG/ML IV SOLN
INTRAVENOUS | Status: AC
  Filled 2013-08-05: qty 5

## 2013-08-05 MED ORDER — SODIUM CHLORIDE 0.9 % IJ SOLN
INTRAMUSCULAR | Status: AC
  Filled 2013-08-05: qty 3

## 2013-08-05 MED ORDER — OXYCODONE-ACETAMINOPHEN 5-325 MG PO TABS: 1.0000 | ORAL_TABLET | ORAL | Status: DC | PRN

## 2013-08-05 MED ORDER — SODIUM CHLORIDE 0.9 % IV SOLN
1.0000 g | INTRAVENOUS | Status: AC
  Administered 2013-08-06: 1 g via INTRAVENOUS
  Filled 2013-08-05: qty 1

## 2013-08-05 MED ORDER — DEXAMETHASONE SODIUM PHOSPHATE 10 MG/ML IJ SOLN
INTRAMUSCULAR | Status: AC
  Filled 2013-08-05: qty 1

## 2013-08-05 MED ORDER — NEOSTIGMINE METHYLSULFATE 1 MG/ML IJ SOLN
INTRAMUSCULAR | Status: DC | PRN
  Administered 2013-08-05: 2 mg via INTRAVENOUS

## 2013-08-05 MED ORDER — EPHEDRINE SULFATE 50 MG/ML IJ SOLN
INTRAMUSCULAR | Status: DC | PRN
  Administered 2013-08-05: 10 mg via INTRAVENOUS
  Administered 2013-08-05 (×2): 5 mg via INTRAVENOUS

## 2013-08-05 MED ORDER — KCL IN DEXTROSE-NACL 20-5-0.45 MEQ/L-%-% IV SOLN
INTRAVENOUS | Status: DC
  Administered 2013-08-05 – 2013-08-08 (×6): via INTRAVENOUS
  Filled 2013-08-05 (×9): qty 1000

## 2013-08-05 MED ORDER — MORPHINE SULFATE 2 MG/ML IJ SOLN: 1.0000 mg | INTRAMUSCULAR | Status: DC | PRN

## 2013-08-05 MED ORDER — LACTATED RINGERS IV SOLN
INTRAVENOUS | Status: DC
  Administered 2013-08-05 (×3): via INTRAVENOUS

## 2013-08-05 MED ORDER — OXYCODONE HCL 5 MG PO TABS: 5.0000 mg | ORAL_TABLET | Freq: Once | ORAL | Status: DC | PRN

## 2013-08-05 MED ORDER — PROMETHAZINE HCL 25 MG/ML IJ SOLN: 6.2500 mg | INTRAMUSCULAR | Status: DC | PRN

## 2013-08-05 MED ORDER — LIDOCAINE HCL 1 % IJ SOLN
INTRAMUSCULAR | Status: AC
Start: 2013-08-05 — End: 2013-08-05
  Filled 2013-08-05: qty 20

## 2013-08-05 MED ORDER — PROPOFOL 10 MG/ML IV BOLUS
INTRAVENOUS | Status: AC
  Filled 2013-08-05: qty 20

## 2013-08-05 MED ORDER — DIPHENHYDRAMINE HCL 12.5 MG/5ML PO ELIX: 12.5000 mg | ORAL_SOLUTION | Freq: Four times a day (QID) | ORAL | Status: DC | PRN

## 2013-08-05 MED ORDER — ACETAMINOPHEN 10 MG/ML IV SOLN
1000.0000 mg | Freq: Once | INTRAVENOUS | Status: DC
  Filled 2013-08-05: qty 100

## 2013-08-05 MED ORDER — 0.9 % SODIUM CHLORIDE (POUR BTL) OPTIME
TOPICAL | Status: DC | PRN
  Administered 2013-08-05: 2000 mL

## 2013-08-05 MED ORDER — HEPARIN SODIUM (PORCINE) 5000 UNIT/ML IJ SOLN: 5000.0000 [IU] | Freq: Three times a day (TID) | INTRAMUSCULAR | Status: DC

## 2013-08-05 MED ORDER — GLYCOPYRROLATE 0.2 MG/ML IJ SOLN
INTRAMUSCULAR | Status: AC
  Filled 2013-08-05: qty 3

## 2013-08-05 MED ORDER — LIDOCAINE HCL (PF) 1 % IJ SOLN
INTRAMUSCULAR | Status: DC | PRN
  Administered 2013-08-05: 13 mL

## 2013-08-05 MED ORDER — ONDANSETRON HCL 4 MG PO TABS: 4.0000 mg | ORAL_TABLET | Freq: Four times a day (QID) | ORAL | Status: DC | PRN

## 2013-08-05 MED ORDER — ONDANSETRON HCL 4 MG/2ML IJ SOLN
4.0000 mg | Freq: Four times a day (QID) | INTRAMUSCULAR | Status: DC | PRN
  Administered 2013-08-07 – 2013-08-08 (×3): 4 mg via INTRAVENOUS
  Filled 2013-08-05 (×3): qty 2

## 2013-08-05 MED ORDER — DEXAMETHASONE SODIUM PHOSPHATE 10 MG/ML IJ SOLN
INTRAMUSCULAR | Status: DC | PRN
  Administered 2013-08-05: 10 mg via INTRAVENOUS

## 2013-08-05 MED ORDER — MIDAZOLAM HCL 2 MG/2ML IJ SOLN
INTRAMUSCULAR | Status: AC
  Filled 2013-08-05: qty 2

## 2013-08-05 MED ORDER — SUCCINYLCHOLINE CHLORIDE 20 MG/ML IJ SOLN
INTRAMUSCULAR | Status: DC | PRN
  Administered 2013-08-05: 100 mg via INTRAVENOUS

## 2013-08-05 MED ORDER — ACETAMINOPHEN 10 MG/ML IV SOLN
INTRAVENOUS | Status: DC | PRN
  Administered 2013-08-05: 1000 mg via INTRAVENOUS

## 2013-08-05 MED ORDER — FENTANYL CITRATE 0.05 MG/ML IJ SOLN
INTRAMUSCULAR | Status: AC
  Filled 2013-08-05: qty 5

## 2013-08-05 MED ORDER — NALOXONE HCL 0.4 MG/ML IJ SOLN: 0.4000 mg | INTRAMUSCULAR | Status: DC | PRN

## 2013-08-05 MED ORDER — GLYCOPYRROLATE 0.2 MG/ML IJ SOLN
INTRAMUSCULAR | Status: DC | PRN
  Administered 2013-08-05: .2 mg via INTRAVENOUS
  Administered 2013-08-05: .4 mg via INTRAVENOUS

## 2013-08-05 MED ORDER — PNEUMOCOCCAL VAC POLYVALENT 25 MCG/0.5ML IJ INJ
0.5000 mL | INJECTION | INTRAMUSCULAR | Status: AC
  Administered 2013-08-06: 0.5 mL via INTRAMUSCULAR
  Filled 2013-08-05 (×2): qty 0.5

## 2013-08-05 MED ORDER — CISATRACURIUM BESYLATE (PF) 10 MG/5ML IV SOLN
INTRAVENOUS | Status: DC | PRN
  Administered 2013-08-05: 8 mg via INTRAVENOUS
  Administered 2013-08-05 (×2): 1 mg via INTRAVENOUS

## 2013-08-05 MED ORDER — SODIUM CHLORIDE 0.9 % IJ SOLN: 9.0000 mL | INTRAMUSCULAR | Status: DC | PRN

## 2013-08-05 MED ORDER — HYDROMORPHONE HCL PF 1 MG/ML IJ SOLN
0.2500 mg | INTRAMUSCULAR | Status: AC | PRN
  Administered 2013-08-05 (×8): 0.25 mg via INTRAVENOUS

## 2013-08-05 MED ORDER — PHENYLEPHRINE HCL 10 MG/ML IJ SOLN
INTRAMUSCULAR | Status: DC | PRN
  Administered 2013-08-05: 80 ug via INTRAVENOUS
  Administered 2013-08-05 (×3): 40 ug via INTRAVENOUS

## 2013-08-05 MED ORDER — SUCCINYLCHOLINE CHLORIDE 20 MG/ML IJ SOLN
INTRAMUSCULAR | Status: AC
  Filled 2013-08-05: qty 2

## 2013-08-05 MED ORDER — FENTANYL CITRATE 0.05 MG/ML IJ SOLN
INTRAMUSCULAR | Status: DC | PRN
  Administered 2013-08-05 (×2): 50 ug via INTRAVENOUS
  Administered 2013-08-05 (×2): 100 ug via INTRAVENOUS
  Administered 2013-08-05 (×2): 50 ug via INTRAVENOUS
  Administered 2013-08-05: 100 ug via INTRAVENOUS

## 2013-08-05 MED ORDER — LIDOCAINE HCL (CARDIAC) 20 MG/ML IV SOLN
INTRAVENOUS | Status: DC | PRN
  Administered 2013-08-05: 50 mg via INTRAVENOUS

## 2013-08-05 MED ORDER — HEPARIN SODIUM (PORCINE) 5000 UNIT/ML IJ SOLN
5000.0000 [IU] | Freq: Three times a day (TID) | INTRAMUSCULAR | Status: DC
  Administered 2013-08-06 – 2013-08-10 (×11): 5000 [IU] via SUBCUTANEOUS
  Filled 2013-08-05 (×14): qty 1

## 2013-08-05 MED ORDER — ONDANSETRON HCL 4 MG PO TABS
4.0000 mg | ORAL_TABLET | Freq: Four times a day (QID) | ORAL | Status: DC
  Administered 2013-08-05 – 2013-08-09 (×12): 4 mg via ORAL
  Filled 2013-08-05 (×27): qty 1

## 2013-08-05 MED ORDER — MORPHINE SULFATE (PF) 1 MG/ML IV SOLN
INTRAVENOUS | Status: AC
  Administered 2013-08-05: 14:00:00
  Filled 2013-08-05: qty 25

## 2013-08-05 MED ORDER — HYDROMORPHONE HCL PF 1 MG/ML IJ SOLN
INTRAMUSCULAR | Status: AC
  Filled 2013-08-05: qty 1

## 2013-08-05 MED ORDER — LACTATED RINGERS IV SOLN
INTRAVENOUS | Status: DC
Start: 2013-08-05 — End: 2013-08-05
  Administered 2013-08-05 (×2): via INTRAVENOUS

## 2013-08-05 MED ORDER — INFLUENZA VAC SPLIT QUAD 0.5 ML IM SUSP
0.5000 mL | INTRAMUSCULAR | Status: AC
Start: 2013-08-06 — End: 2013-08-06
  Administered 2013-08-06: 0.5 mL via INTRAMUSCULAR
  Filled 2013-08-05 (×2): qty 0.5

## 2013-08-05 MED ORDER — MIDAZOLAM HCL 5 MG/5ML IJ SOLN
INTRAMUSCULAR | Status: DC | PRN
  Administered 2013-08-05 (×2): 1 mg via INTRAVENOUS

## 2013-08-05 MED ORDER — OXYCODONE HCL 5 MG/5ML PO SOLN
5.0000 mg | Freq: Once | ORAL | Status: DC | PRN
  Filled 2013-08-05: qty 5

## 2013-08-05 MED ORDER — OSMOLITE 1.5 CAL PO LIQD
1000.0000 mL | ORAL | Status: DC
  Administered 2013-08-05 – 2013-08-06 (×2): 1000 mL
  Filled 2013-08-05 (×2): qty 1000

## 2013-08-05 MED ORDER — TISSEEL VH 10 ML EX KIT
PACK | CUTANEOUS | Status: AC
  Filled 2013-08-05: qty 1

## 2013-08-05 MED ORDER — SODIUM CHLORIDE 0.9 % IV SOLN
1.0000 g | INTRAVENOUS | Status: AC
  Administered 2013-08-05: 1 g via INTRAVENOUS

## 2013-08-05 MED ORDER — ONDANSETRON HCL 4 MG/2ML IJ SOLN
INTRAMUSCULAR | Status: DC | PRN
  Administered 2013-08-05: 4 mg via INTRAVENOUS

## 2013-08-05 MED ORDER — NEOSTIGMINE METHYLSULFATE 1 MG/ML IJ SOLN
INTRAMUSCULAR | Status: AC
  Filled 2013-08-05: qty 10

## 2013-08-05 MED ORDER — MORPHINE SULFATE (PF) 1 MG/ML IV SOLN
INTRAVENOUS | Status: DC
  Administered 2013-08-05: 19.19 mg via INTRAVENOUS
  Administered 2013-08-05: 24 mg via INTRAVENOUS
  Administered 2013-08-05: 22.5 mg via INTRAVENOUS
  Administered 2013-08-05: 11:00:00 via INTRAVENOUS
  Administered 2013-08-06: 24.94 mg via INTRAVENOUS
  Administered 2013-08-06: 35.44 mg via INTRAVENOUS
  Administered 2013-08-06: 25.95 mg via INTRAVENOUS
  Administered 2013-08-06 (×4): via INTRAVENOUS
  Administered 2013-08-07: 25.03 mg via INTRAVENOUS
  Administered 2013-08-07: 0.933 mg via INTRAVENOUS
  Administered 2013-08-07: 15 mg via INTRAVENOUS
  Administered 2013-08-07: 21 mg via INTRAVENOUS
  Administered 2013-08-07: 11.87 mg via INTRAVENOUS
  Administered 2013-08-07: 16:00:00 via INTRAVENOUS
  Administered 2013-08-07: 11.9 mg via INTRAVENOUS
  Administered 2013-08-07: 01:00:00 via INTRAVENOUS
  Administered 2013-08-07: 29.34 mg via INTRAVENOUS
  Administered 2013-08-08: 12:00:00 via INTRAVENOUS
  Administered 2013-08-08: 10.5 mg via INTRAVENOUS
  Administered 2013-08-08: 19.5 mg via INTRAVENOUS
  Administered 2013-08-08 (×2): via INTRAVENOUS
  Administered 2013-08-08: 26.13 mg via INTRAVENOUS
  Administered 2013-08-08: 15 mg via INTRAVENOUS
  Filled 2013-08-05 (×15): qty 25

## 2013-08-05 MED ORDER — BUPIVACAINE-EPINEPHRINE PF 0.25-1:200000 % IJ SOLN
INTRAMUSCULAR | Status: AC
Start: 2013-08-05 — End: 2013-08-05
  Filled 2013-08-05: qty 30

## 2013-08-05 MED ORDER — PROPOFOL 10 MG/ML IV BOLUS
INTRAVENOUS | Status: DC | PRN
  Administered 2013-08-05: 140 mg via INTRAVENOUS

## 2013-08-05 MED ORDER — OSMOLITE 1.5 CAL PO LIQD
10.0000 mL/h | ORAL | Status: DC
  Filled 2013-08-05: qty 237

## 2013-08-05 MED ORDER — MEPERIDINE HCL 50 MG/ML IJ SOLN: 6.2500 mg | INTRAMUSCULAR | Status: DC | PRN

## 2013-08-05 MED ORDER — HYDROMORPHONE HCL PF 2 MG/ML IJ SOLN
INTRAMUSCULAR | Status: AC
  Filled 2013-08-05: qty 1

## 2013-08-05 MED ORDER — ONDANSETRON HCL 4 MG/2ML IJ SOLN: 4.0000 mg | Freq: Four times a day (QID) | INTRAMUSCULAR | Status: DC | PRN

## 2013-08-05 MED ORDER — SODIUM CHLORIDE 0.9 % IV SOLN
INTRAVENOUS | Status: AC
  Filled 2013-08-05: qty 1

## 2013-08-05 MED ORDER — ONDANSETRON HCL 4 MG/2ML IJ SOLN
INTRAMUSCULAR | Status: AC
  Filled 2013-08-05: qty 2

## 2013-08-05 MED ORDER — CISATRACURIUM BESYLATE 20 MG/10ML IV SOLN
INTRAVENOUS | Status: AC
  Filled 2013-08-05: qty 10

## 2013-08-05 MED ORDER — HYDROMORPHONE HCL PF 1 MG/ML IJ SOLN
INTRAMUSCULAR | Status: DC | PRN
  Administered 2013-08-05 (×2): 1 mg via INTRAVENOUS

## 2013-08-05 MED ORDER — DIPHENHYDRAMINE HCL 50 MG/ML IJ SOLN: 12.5000 mg | Freq: Four times a day (QID) | INTRAMUSCULAR | Status: DC | PRN

## 2013-08-05 MED ORDER — LIP MEDEX EX OINT
TOPICAL_OINTMENT | CUTANEOUS | Status: AC
  Administered 2013-08-05: 13:00:00
  Filled 2013-08-05: qty 7

## 2013-08-05 MED ORDER — BUPIVACAINE-EPINEPHRINE 0.25% -1:200000 IJ SOLN
INTRAMUSCULAR | Status: DC | PRN
  Administered 2013-08-05: 13 mL

## 2013-08-05 SURGICAL SUPPLY — 87 items
APPLIER CLIP 5 13 M/L LIGAMAX5 (MISCELLANEOUS)
APPLIER CLIP ROT 10 11.4 M/L (STAPLE)
BAG URINE DRAINAGE (UROLOGICAL SUPPLIES) IMPLANT
BENZOIN TINCTURE PRP APPL 2/3 (GAUZE/BANDAGES/DRESSINGS) ×2 IMPLANT
BLADE EXTENDED COATED 6.5IN (ELECTRODE) ×2 IMPLANT
BLADE HEX COATED 2.75 (ELECTRODE) ×2 IMPLANT
CANISTER SUCTION 2500CC (MISCELLANEOUS) ×2 IMPLANT
CATH FOLEY 2WAY SLVR  5CC 14FR (CATHETERS)
CATH FOLEY 2WAY SLVR  5CC 16FR (CATHETERS)
CATH FOLEY 2WAY SLVR  5CC 18FR (CATHETERS)
CATH FOLEY 2WAY SLVR  5CC 20FR (CATHETERS)
CATH FOLEY 2WAY SLVR 5CC 14FR (CATHETERS) IMPLANT
CATH FOLEY 2WAY SLVR 5CC 16FR (CATHETERS) IMPLANT
CATH FOLEY 2WAY SLVR 5CC 18FR (CATHETERS) IMPLANT
CATH FOLEY 2WAY SLVR 5CC 20FR (CATHETERS) IMPLANT
CATH ROBINSON RED A/P 20FR (CATHETERS) ×2 IMPLANT
CHLORAPREP W/TINT 26ML (MISCELLANEOUS) ×2 IMPLANT
CLIP APPLIE 5 13 M/L LIGAMAX5 (MISCELLANEOUS) IMPLANT
CLIP APPLIE ROT 10 11.4 M/L (STAPLE) IMPLANT
CONT SPECI 4OZ STER CLIK (MISCELLANEOUS) IMPLANT
DECANTER SPIKE VIAL GLASS SM (MISCELLANEOUS) ×2 IMPLANT
DISSECTOR ROUND CHERRY 3/8 STR (MISCELLANEOUS) IMPLANT
DRAIN CHANNEL RND F F (WOUND CARE) IMPLANT
DRAPE CAMERA CLOSED 9X96 (DRAPES) IMPLANT
DRAPE LG THREE QUARTER DISP (DRAPES) IMPLANT
DRAPE WARM FLUID 44X44 (DRAPE) ×2 IMPLANT
DRESSING TELFA ISLAND 4X8 (GAUZE/BANDAGES/DRESSINGS) IMPLANT
DRSG TEGADERM 2-3/8X2-3/4 SM (GAUZE/BANDAGES/DRESSINGS) ×2 IMPLANT
DRSG TELFA 3X8 NADH (GAUZE/BANDAGES/DRESSINGS) IMPLANT
DRSG TELFA 4X10 ISLAND STR (GAUZE/BANDAGES/DRESSINGS) IMPLANT
DRSG TELFA PLUS 4X6 ADH ISLAND (GAUZE/BANDAGES/DRESSINGS) IMPLANT
EVACUATOR SILICONE 100CC (DRAIN) IMPLANT
GAUZE SPONGE 2X2 8PLY STRL LF (GAUZE/BANDAGES/DRESSINGS) ×1 IMPLANT
GAUZE SPONGE 4X4 16PLY XRAY LF (GAUZE/BANDAGES/DRESSINGS) IMPLANT
GLOVE BIO SURGEON STRL SZ 6 (GLOVE) ×2 IMPLANT
GLOVE INDICATOR 6.5 STRL GRN (GLOVE) ×4 IMPLANT
GOWN PREVENTION PLUS LG XLONG (DISPOSABLE) IMPLANT
GOWN PREVENTION PLUS XLARGE (GOWN DISPOSABLE) IMPLANT
GOWN PREVENTION PLUS XXLARGE (GOWN DISPOSABLE) ×2 IMPLANT
HEMOSTAT SURGICEL 4X8 (HEMOSTASIS) IMPLANT
KIT BASIN OR (CUSTOM PROCEDURE TRAY) ×2 IMPLANT
NEEDLE BIOPSY 14GX4.5 SOFT TIS (NEEDLE) IMPLANT
NEEDLE BIOPSY 14X6 SOFT TISS (NEEDLE) IMPLANT
NEEDLE HYPO 22GX1.5 SAFETY (NEEDLE) ×2 IMPLANT
NS IRRIG 1000ML POUR BTL (IV SOLUTION) ×4 IMPLANT
PACK GENERAL/GYN (CUSTOM PROCEDURE TRAY) ×2 IMPLANT
PACK UNIVERSAL I (CUSTOM PROCEDURE TRAY) ×2 IMPLANT
PENCIL BUTTON BLDE SNGL 10FT (ELECTRODE) ×2 IMPLANT
PLUG CATH AND CAP STER (CATHETERS) IMPLANT
SET IRRIG TUBING LAPAROSCOPIC (IRRIGATION / IRRIGATOR) IMPLANT
SHEARS FOC LG CVD HARMONIC 17C (MISCELLANEOUS) IMPLANT
SLEEVE SURGEON STRL (DRAPES) IMPLANT
SOLUTION ANTI FOG 6CC (MISCELLANEOUS) ×2 IMPLANT
SPONGE DRAIN TRACH 4X4 STRL 2S (GAUZE/BANDAGES/DRESSINGS) ×2 IMPLANT
SPONGE GAUZE 2X2 STER 10/PKG (GAUZE/BANDAGES/DRESSINGS) ×1
SPONGE GAUZE 4X4 12PLY (GAUZE/BANDAGES/DRESSINGS) IMPLANT
SPONGE LAP 18X18 X RAY DECT (DISPOSABLE) ×4 IMPLANT
STAPLER VISISTAT 35W (STAPLE) ×2 IMPLANT
STRIP CLOSURE SKIN 1/2X4 (GAUZE/BANDAGES/DRESSINGS) ×2 IMPLANT
SUCTION POOLE TIP (SUCTIONS) IMPLANT
SUT ETHILON 2 0 PS N (SUTURE) ×4 IMPLANT
SUT MNCRL AB 4-0 PS2 18 (SUTURE) IMPLANT
SUT PDS AB 1 CT1 27 (SUTURE) ×4 IMPLANT
SUT PDS AB 1 TP1 96 (SUTURE) IMPLANT
SUT SILK 2 0 (SUTURE) ×1
SUT SILK 2 0 SH CR/8 (SUTURE) ×2 IMPLANT
SUT SILK 2-0 18XBRD TIE 12 (SUTURE) ×1 IMPLANT
SUT SILK 3 0 SH CR/8 (SUTURE) ×2 IMPLANT
SUT VIC AB 3-0 SH 27 (SUTURE) ×1
SUT VIC AB 3-0 SH 27X BRD (SUTURE) ×1 IMPLANT
SUT VICRYL 2 0 18  UND BR (SUTURE)
SUT VICRYL 2 0 18 UND BR (SUTURE) IMPLANT
SUT VICRYL 3 0 BR 18  UND (SUTURE)
SUT VICRYL 3 0 BR 18 UND (SUTURE) IMPLANT
SYR CONTROL 10ML LL (SYRINGE) ×2 IMPLANT
SYS LAPSCP GELPORT 120MM (MISCELLANEOUS)
SYSTEM LAPSCP GELPORT 120MM (MISCELLANEOUS) IMPLANT
TAPE CLOTH SURG 4X10 WHT LF (GAUZE/BANDAGES/DRESSINGS) ×2 IMPLANT
TOWEL OR 17X26 10 PK STRL BLUE (TOWEL DISPOSABLE) ×2 IMPLANT
TRAY FOLEY CATH 14FRSI W/METER (CATHETERS) ×2 IMPLANT
TROCAR BLADELESS OPT 5 75 (ENDOMECHANICALS) ×2 IMPLANT
TROCAR XCEL 12X100 BLDLESS (ENDOMECHANICALS) IMPLANT
TROCAR XCEL BLUNT TIP 100MML (ENDOMECHANICALS) ×2 IMPLANT
TROCAR XCEL NON-BLD 11X100MML (ENDOMECHANICALS) IMPLANT
TROCAR XCEL UNIV SLVE 11M 100M (ENDOMECHANICALS) IMPLANT
TUBING INSUFFLATION 10FT LAP (TUBING) IMPLANT
YANKAUER SUCT BULB TIP NO VENT (SUCTIONS) ×2 IMPLANT

## 2013-08-05 NOTE — Interval H&P Note (Signed)
History and Physical Interval Note:  08/05/2013 7:38 AM  Evan Peterson  has presented today for surgery, with the diagnosis of gastric cancer  The various methods of treatment have been discussed with the patient and family. After consideration of risks, benefits and other options for treatment, the patient has consented to  Procedure(s): Laparoscopy Diagnostic  DISTAL GASTRECTOMY (N/A) PERCUTANEOUS ENDOSCOPIC GASTROSTOMY (PEG) PLACEMENT (N/A) as a surgical intervention .  The patient's history has been reviewed, patient examined, no change in status, stable for surgery.  I have reviewed the patient's chart and labs.  Questions were answered to the patient's satisfaction.     [name]

## 2013-08-05 NOTE — H&P (View-Only) (Signed)
Chief complaint:  New diagnosis of gastric cancer  HISTORY: Patient is a 52 year old male who presents with approximately one year of abdominal pain. He has had progressively decreasing appetite over this time and has started to have nausea and vomiting. He sometimes will throw up in the morning what he ate the night before. He denies any bloody stools. He has lost approximately 10 pounds. He is from [place] but has been here for 30 years. He also has had some issues with diarrhea and some concentrated urine. He was evaluated by Dr. Bosie Clos and was found on endoscopy to have a mass on the lesser curvature of the stomach. 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 He has had a noncontrasted CT scan which is negative for metastatic disease.  He was taking NSAIDs previously but stopped these around 9 months ago. He did not have any improvement in symptoms since then. Since his diagnosis, he has been taking Percocet at night. He has continued to work as a Psychologist, occupational during the day. He continues to feel weak because of his difficulty with his diet.  Past Medical History  Diagnosis Date  . Cancer     stomach    Past Surgical History  Procedure Laterality Date  . Upper gi endoscopy      Current Outpatient Prescriptions  Medication Sig Dispense Refill  . omeprazole (PRILOSEC) 20 MG capsule Take 20 mg by mouth daily.      . ondansetron (ZOFRAN) 4 MG tablet Take 1 tablet (4 mg total) by mouth every 6 (six) hours.  12 tablet  0  . oxyCODONE-acetaminophen (PERCOCET/ROXICET) 5-325 MG per tablet Take 1-2 tablets by mouth every 6 (six) hours as needed for severe pain.  20 tablet  0   No current facility-administered medications for this visit.     Allergies  Allergen Reactions  . Asa [Aspirin] Nausea And Vomiting  . Clarithromycin Other (See Comments)    More pain and nausea     No family history on file.   History   Social History  . Marital Status: Married    Spouse  Name: N/A    Number of Children: N/A  . Years of Education: N/A   Social History Main Topics  . Smoking status: Current Every Day Smoker  . Smokeless tobacco: None  . Alcohol Use: No  . Drug Use: No  . Sexual Activity: None   Other Topics Concern  . None   Social History Narrative  . None     REVIEW OF SYSTEMS - PERTINENT POSITIVES ONLY: 12 point review of systems negative other than HPI and PMH except for chills, cough, abdominal pain, nausea, vomiting, diarrhea, difficulty urinating, and weakness.  EXAMCeasar Mons Vitals:   07/22/13 0843  BP: 118/70  Pulse: 80  Temp: 98.1 F (36.7 C)  Resp: 14   Filed Weights   07/22/13 0843  Weight: 106 lb (48.081 kg)    Gen:  No acute distress.  Very thin, cachectic and well groomed.   Neurological: Alert and oriented to person, place, and time. Coordination normal.  Head: Normocephalic and atraumatic.  Eyes: Conjunctivae are normal. Pupils are equal, round, and reactive to light. No scleral icterus.  Neck: Normal range of motion. Neck supple. No tracheal deviation or thyromegaly present.  Cardiovascular: Normal rate, regular rhythm, normal heart sounds and intact distal pulses.  Exam reveals no gallop and no friction rub.  No murmur heard. Respiratory: Effort normal.  No respiratory distress. No chest wall tenderness. Breath sounds  normal.  No wheezes, rales or rhonchi.  GI: Soft. Bowel sounds are normal. The abdomen is soft and nontender.  There is no rebound and no guarding.  Musculoskeletal: Normal range of motion. Extremities are nontender.  Lymphadenopathy: No cervical, preauricular, postauricular or axillary adenopathy is present Skin: Skin is warm and dry. No rash noted. No diaphoresis. No erythema. No pallor. No clubbing, cyanosis, or edema.   Psychiatric: Normal mood and affect. Behavior is normal. Judgment and thought content normal.    LABORATORY RESULTS: Available labs are reviewed  CMET essentially normal. Albumin  3.6. CBC essentially normal.   RADIOLOGY RESULTS: See E-Chart or I-Site for most recent results.  Images and reports are reviewed. Non contrasted CT abd/pelvis IMPRESSION:  Limited noncontrasted study again demonstrates moderate wall  thickening of the distal stomach, consistent with history of gastric  carcinoma. There is a nonobstructive bowel gas pattern. Bowel is not  evaluated in detail given the limited volume of oral contrast  present for this examination.    ASSESSMENT AND PLAN: Gastric cancer Patient appears to have localized distal gastric cancer. However, he has not had any chest imaging and has also not had any contrasted abdominal imaging.  The patient thinks that he has an allergy to IV contrast. I cannot find documentation of that, but the patient states that it makes him feel like his heart is going to stop.  After discussion with Dr. Purcell Mouton in radiology, I'm ordering a PET scan to get the most information regarding the liver and the lungs.  He has lost weight, but his albumin is 3.6.  As long as there is no evidence of metastatic disease, my plan would be to do a diagnostic laparoscopy, probable distal gastrectomy and feeding tube.  I discussed the risk of surgery including bleeding, infection, leak of surgical connections, feeding tube complications, postoperative restrictions, prolonged nausea and vomiting, cardiac and pulmonary complications, and risks of finding metastatic disease.  I discussed that I would not resect the primary cancer if we found metastatic disease on the PET scan or at the time of diagnostic laparoscopy.     40 min spent in evaluation, examination, counseling, and coordination of care.    Maudry Diego MD Surgical Oncology, General and Endocrine Surgery [facility]      Visit Diagnoses: 1. Gastric cancer     Primary Care Physician: Verlon Au, MD  Doy Mince.

## 2013-08-05 NOTE — Progress Notes (Signed)
INITIAL NUTRITION ASSESSMENT  DOCUMENTATION CODES Per approved criteria  -Severe malnutrition in the context of chronic illness -Underweight  Pt meets criteria for severe MALNUTRITION in the context of chronic disease as evidenced by 8% unintentional weight loss in one month period and < 75% est energy intake for > one month.  INTERVENTION: -Initiate Osmolite 1.5 @ 10 ml/hr via PEJ and increase by 10 ml every 8 hours to goal rate of 80 ml/hr. Once pt is tolerating goal rate of 80 ml/hr, hold EN and transition to nocturnal feed. Run from 7pm-7am at 80 ml/hr (12 hour continuous nocturnal feed).  At goal rate, tube feeding regimen will provide 1440 kcal, 60 grams of protein, and 732 ml of H2O.   -Recomend Ensure BID as diet advancement tolerated.   NUTRITION DIAGNOSIS: Inadequate oral intake related to n/v as evidenced by unintentional wt loss of 10 lbs over two weeks, PO intake <75% of est nutr needs.   Goal: TF + PO intake to meet >/= 90% of their estimated nutrition needs    Monitor:  Total protein/energy intake, Diet order, GI profile, Labs, Weights  Reason for Assessment: Tube Feeding Consult  52 y.o. male  Admitting Dx: <principal problem not specified>  ASSESSMENT:  Patient is a 52 year old male who presents with approximately one year of abdominal pain. He has had progressively decreasing appetite over this time and has started to have nausea and vomiting. He sometimes will throw up in the morning what he ate the night before. He denies any bloody stools. He has lost approximately 10 pounds. He is from [place] but has been here for 30 years. He also has had some issues with diarrhea and some concentrated urine. He was evaluated by Dr. Bosie Clos and was found on endoscopy to have a mass on the lesser curvature of the stomach. 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 He has had a noncontrasted CT scan which is negative for metastatic disease. He was taking  NSAIDs previously but stopped these around 9 months ago. He did not have any improvement in symptoms since then. Since his diagnosis, he has been taking Percocet at night. He has continued to work as a Psychologist, occupational during the day. He continues to feel weak because of his difficulty with his diet.  -Pt underwent PEJ tube placement on 08/05/2013 d/t inability to meet est nutr needs via PO intake -Pt was seen by outpt RD on 07/29/2013. Weight was 106 lbs, has decreased 4 lbs in one week. -Per discussion with pt and pt's wife, pt has lost approx 10 lbs over a two week period. Has not been able to consume solid foods d/t n/v/abd pain. Diet has largely consisted of Ensure shakes, which pt would consume 1-2 daily. -Discussed pt with MD. MD requesting pt receive EN nocturnal feeds to meet > 75% est nutr needs as pt will likely continue with minimal PO intake. Requested EN to be started on 08/05/2013, with diet advancement to full liquids within 1-2 days.  Height: Ht Readings from Last 1 Encounters:  08/05/13 5\' 3"  (1.6 m)    Weight: Wt Readings from Last 1 Encounters:  08/05/13 102 lb 3.2 oz (46.358 kg)    Ideal Body Weight: 124 lbs/56.4 kg  % Ideal Body Weight: 82%  Wt Readings from Last 10 Encounters:  08/05/13 102 lb 3.2 oz (46.358 kg)  08/05/13 102 lb 3.2 oz (46.358 kg)  08/03/13 102 lb 3.2 oz (46.358 kg)  07/22/13 106 lb (48.081 kg)  11/22/12 105 lb (  47.628 kg)    Usual Body Weight: 112-115 lbs  % Usual Body Weight: 90%  BMI:  Body mass index is 18.11 kg/(m^2).  Estimated Nutritional Needs: Kcal: 1400-1600 kcal (30-35 kcal/46.4kg) Protein: 70-90 grams (1.5-2.0g/46.4 kg) Fluid: 1600 ml/daily  Skin: Dry  Diet Order: Clear Liquid  EDUCATION NEEDS: -No education needs identified at this time   Intake/Output Summary (Last 24 hours) at 08/05/13 1508 Last data filed at 08/05/13 1400  Gross per 24 hour  Intake   3510 ml  Output    750 ml  Net   2760 ml    Last BM: None during  admit  Labs:   Recent Labs Lab 08/03/13 1030 08/05/13 1242  NA 133*  --   K 4.9  --   CL 97  --   CO2 30  --   BUN 10  --   CREATININE 0.93 0.78  CALCIUM 9.7  --   GLUCOSE 135*  --     CBG (last 3)   Recent Labs  08/03/13 1224  GLUCAP 92    Scheduled Meds: . [START ON 08/06/2013] ertapenem (INVANZ) IV  1 g Intravenous Q24H  . [START ON 08/06/2013] heparin  5,000 Units Subcutaneous Q8H  . HYDROmorphone      . HYDROmorphone      . [START ON 08/06/2013] influenza vac split quadrivalent PF  0.5 mL Intramuscular Tomorrow-1000  . morphine   Intravenous Q4H  . ondansetron  4 mg Oral Q6H  . [START ON 08/06/2013] pneumococcal 23 valent vaccine  0.5 mL Intramuscular Tomorrow-1000  . sodium chloride        Continuous Infusions: . dextrose 5 % and 0.45 % NaCl with KCl 20 mEq/L 100 mL/hr at 08/05/13 1338    Past Medical History  Diagnosis Date  . Cancer     stomach    Past Surgical History  Procedure Laterality Date  . Upper gi endoscopy      Lloyd Huger MS RD LDN Clinical Dietitian Pager:[phone]

## 2013-08-05 NOTE — Anesthesia Postprocedure Evaluation (Signed)
Anesthesia Post Note  Patient: Evan Peterson  Procedure(s) Performed: Procedure(s) (LRB): Laparoscopy Diagnostic   (N/A)  GASTROSTOMY (PEG) PLACEMENT (N/A)  Anesthesia type: General  Patient location: PACU  Post pain: Pain level controlled  Post assessment: Post-op Vital signs reviewed  Last Vitals: BP 112/72  Pulse 92  Temp(Src) 37.4 C (Oral)  Resp 15  Ht 5\' 3"  (1.6 m)  Wt 102 lb 3.2 oz (46.358 kg)  BMI 18.11 kg/m2  SpO2 98%  Post vital signs: Reviewed  Level of consciousness: sedated  Complications: No apparent anesthesia complications

## 2013-08-05 NOTE — Anesthesia Preprocedure Evaluation (Signed)
Anesthesia Evaluation  Patient identified by MRN, date of birth, ID band Patient awake    Reviewed: Allergy & Precautions, H&P , NPO status , Patient's Chart, lab work & pertinent test results  Airway Mallampati: II TM Distance: >3 FB Neck ROM: Full    Dental  (+) Dental Advisory Given and Teeth Intact   Pulmonary Current Smoker,  breath sounds clear to auscultation        Cardiovascular negative cardio ROS  Rhythm:Regular Rate:Normal     Neuro/Psych negative neurological ROS  negative psych ROS   GI/Hepatic negative GI ROS, Neg liver ROS,   Endo/Other  negative endocrine ROS  Renal/GU negative Renal ROS     Musculoskeletal negative musculoskeletal ROS (+)   Abdominal   Peds  Hematology negative hematology ROS (+)   Anesthesia Other Findings   Reproductive/Obstetrics negative OB ROS                           Anesthesia Physical Anesthesia Plan  ASA: II  Anesthesia Plan: General   Post-op Pain Management:    Induction: Intravenous  Airway Management Planned: Oral ETT  Additional Equipment:   Intra-op Plan:   Post-operative Plan: Extubation in OR  Informed Consent: I have reviewed the patients History and Physical, chart, labs and discussed the procedure including the risks, benefits and alternatives for the proposed anesthesia with the patient or authorized representative who has indicated his/her understanding and acceptance.   Dental advisory given  Plan Discussed with: CRNA  Anesthesia Plan Comments:         Anesthesia Quick Evaluation

## 2013-08-05 NOTE — Transfer of Care (Signed)
Immediate Anesthesia Transfer of Care Note  Patient: Evan Peterson  Procedure(s) Performed: Procedure(s): Laparoscopy Diagnostic   (N/A)  GASTROSTOMY (PEG) PLACEMENT (N/A)  Patient Location: PACU  Anesthesia Type:General  Level of Consciousness: awake, oriented, patient cooperative and responds to stimulation  Airway & Oxygen Therapy: Patient Spontanous Breathing and Patient connected to face mask oxygen  Post-op Assessment: Report given to PACU RN, Post -op Vital signs reviewed and stable and Patient moving all extremities  Post vital signs: Reviewed and stable  Complications: No apparent anesthesia complications

## 2013-08-05 NOTE — Anesthesia Procedure Notes (Signed)
Procedure Name: Intubation Date/Time: 08/05/2013 7:59 AM Performed by: Edison Pace Pre-anesthesia Checklist: Patient identified, Patient being monitored, Emergency Drugs available, Timeout performed and Suction available Patient Re-evaluated:Patient Re-evaluated prior to inductionOxygen Delivery Method: Circle system utilized Preoxygenation: Pre-oxygenation with 100% oxygen Intubation Type: IV induction and Cricoid Pressure applied Ventilation: Mask ventilation without difficulty Laryngoscope Size: Mac and 4 Grade View: Grade II Tube type: Oral Tube size: 7.5 mm Number of attempts: 1 Airway Equipment and Method: Stylet Placement Confirmation: ETT inserted through vocal cords under direct vision,  positive ETCO2 and breath sounds checked- equal and bilateral (slightly anterior. cords vis with head lift/cricoid press) Secured at: 21 cm Tube secured with: Tape Dental Injury: Teeth and Oropharynx as per pre-operative assessment  Difficulty Due To: Difficulty was unanticipated

## 2013-08-05 NOTE — Preoperative (Signed)
Beta Blockers   Reason not to administer Beta Blockers:Not Applicable 

## 2013-08-05 NOTE — Op Note (Signed)
PRE-OPERATIVE DIAGNOSIS:  Gastric cancer  POST-OPERATIVE DIAGNOSIS:  Same  PROCEDURE:  Procedure(s): Diagnostic laparoscopy, mesenteric biopsies, peritoneal biopsies, jejunostomy tube  SURGEON:  Surgeon(s): Almond Lint, MD  ASSISTANT:  Carman Ching, MD  ANESTHESIA:   general  DRAINS: Jejunostomy Tube   LOCAL MEDICATIONS USED:  MARCAINE    and XYLOCAINE   SPECIMEN:  Source of Specimen:  peritoneal nodules, mesenteric nodules, transverse colon lymph node, peritoneal aspirates and washings  DISPOSITION OF SPECIMEN:  PATHOLOGY and cytology  COUNTS:  YES  DICTATION: .Dragon Dictation  PLAN OF CARE: Admit to inpatient   PATIENT DISPOSITION:  PACU - hemodynamically stable.  FINDINGS:  Carcinomatosis, fixed gastric cancer to retroperitoneum.    PROCEDURE:   Patient was identified in the holding area and taken the operating room where he was placed on the operating room table. Arms were tucked. General anesthesia was induced and a Foley catheter was placed. Timeout was performed according to the surgical safety checklist. When all is correct, we continued.  A vertical incision was made in the supraumbilical location after administration with was. The subcutaneous tissues were divided with Kelly clamp. The fascia was elevated and incised in the midline. A pursestring suture was placed around the fascial incision. The [facility] trocar was advanced into the abdomen and secured to the abdominal wall with the tails of the suture. Pneumoperitoneum was achieved to a pressure of 15 mmHg. A second 5 mm port was placed in the right midabdomen. There were multiple abdominal wall nodules consistent with carcinomatosis. This was biopsied and sent for frozen section. The original frozen section returned as fibrous nodules the with the caveat that gastric cancer is very difficult to see on frozen section. Additional nodules were sent. Also multiple small bowel mesenteric nodules were identified and sent.  Small amount of ascites is noted in the suprahepatic location and this was sent for cytology.  The Roseanne Reno site was incised to approximately 5 cm J-tube was performed. This was done approximately 20 cm beyond the ligament of Treitz. A pursestring suture was created with a 3-0 silk. The abdominal wall was incised with a 15 blade and the hemostat was used to create an incision into the peritoneal cavity. A 20 French red rubber catheter was pulled through the abdominal wall. The small bowel is opened and this was advanced distally into the small bowel. Pursestring suture was tied down. The J-tube was secured in the jejunum with 5 Witzel sutures.  It was then secured to the abdominal wall with 2-0 silk. This was done around the tube insertion site as well as 2 additional sutures 2 packs the small bowel in place and avoid volvulus.  The additional nodules were still negative despite the clinical appearance of carcinomatosis. The incision was enlarged a little but more air and the stomach was found to be adherent to the retroperitoneum. There were also multiple lymph nodes in the transverse colon mesentery. A representative lipase are frozen. This was confirmatory for metastatic adenocarcinoma. Due to the transverse mesenteric lymph node consisting of a distant metastasis, as well as the fixation to the retroperitoneum, and attempted gastrectomy is aborted.  The omentum was pulled down over the small bowel. The fascia was closed using #1 PDS suture in running fashion. The skin was then closed using 4-0 Monocryl. The wounds are clean, dry, and dressed with Steri-Strips and benzoin. The patient was allowed to emerge from anesthesia and taken to the PACU in stable condition.

## 2013-08-06 DIAGNOSIS — Z934 Other artificial openings of gastrointestinal tract status: Secondary | ICD-10-CM

## 2013-08-06 LAB — CBC
HCT: 36.7 % — ABNORMAL LOW (ref 39.0–52.0)
Hemoglobin: 12.9 g/dL — ABNORMAL LOW (ref 13.0–17.0)
MCH: 33.9 pg (ref 26.0–34.0)
MCHC: 35.1 g/dL (ref 30.0–36.0)
MCV: 96.6 fL (ref 78.0–100.0)
Platelets: 478 10*3/uL — ABNORMAL HIGH (ref 150–400)
RDW: 13.5 % (ref 11.5–15.5)

## 2013-08-06 LAB — GLUCOSE, CAPILLARY
Glucose-Capillary: 146 mg/dL — ABNORMAL HIGH (ref 70–99)
Glucose-Capillary: 143 mg/dL — ABNORMAL HIGH (ref 70–99)

## 2013-08-06 LAB — BASIC METABOLIC PANEL
BUN: 8 mg/dL (ref 6–23)
Calcium: 8.9 mg/dL (ref 8.4–10.5)
Chloride: 96 mEq/L (ref 96–112)
Creatinine, Ser: 0.78 mg/dL (ref 0.50–1.35)
GFR calc Af Amer: >90 mL/min (ref >90)
GFR calc non Af Amer: >90 mL/min (ref >90)
Glucose, Bld: 131 mg/dL — ABNORMAL HIGH (ref 70–99)

## 2013-08-06 NOTE — Progress Notes (Signed)
Patient ID: Evan Peterson, male   DOB: 03/14/1961, 52 y.o.   MRN: [id number] 1 Day Post-Op  Subjective: Some incisional pain, not severe, better with medications. No nausea. Sipping on clear liquids. Tube feeding has been started.  Objective: Vital signs in last 24 hours: Temp:  [97.6 F (36.4 C)-99.3 F (37.4 C)] 98.3 F (36.8 C) (12/20 0645) Pulse Rate:  [82-104] 88 (12/20 0645) Resp:  [10-20] 16 (12/20 0752) BP: (112-153)/(66-80) 115/73 mmHg (12/20 0645) SpO2:  [94 %-100 %] 98 % (12/20 0645) FiO2 (%):  [97 %] 97 % (12/20 0752) Weight:  [102 lb 3.2 oz (46.358 kg)-105 lb 13.1 oz (48 kg)] 105 lb 13.1 oz (48 kg) (12/20 0645)    Intake/Output from previous day: 12/19 0701 - 12/20 0700 In: 5226.7 [P.O.:120; I.V.:5086.7] Out: 4800 [Urine:4800] Intake/Output this shift:    General appearance: alert, cooperative and no distress Resp: clear to auscultation bilaterally GI: nondistended, mild appropriate tenderness. Incision/Wound: dressing clean and dry.  Lab Results:   Recent Labs  08/05/13 1242 08/06/13 0550  WBC 14.0* 15.1*  HGB 13.3 12.9*  HCT 38.1* 36.7*  PLT 472* 478*   BMET  Recent Labs  08/03/13 1030 08/05/13 1242 08/06/13 0550  NA 133*  --  135  K 4.9  --  4.4  CL 97  --  96  CO2 30  --  31  GLUCOSE 135*  --  131*  BUN 10  --  8  CREATININE 0.93 0.78 0.78  CALCIUM 9.7  --  8.9     Studies/Results: No results found.  Anti-infectives: Anti-infectives   Start     Dose/Rate Route Frequency Ordered Stop   08/06/13 0800  ertapenem (INVANZ) 1 g in sodium chloride 0.9 % 50 mL IVPB     1 g 100 mL/hr over 30 Minutes Intravenous Every 24 hours 08/05/13 1202 08/07/13 0759   08/05/13 0553  ertapenem (INVANZ) 1 g in sodium chloride 0.9 % 50 mL IVPB     1 g 100 mL/hr over 30 Minutes Intravenous On call to O.R. 08/05/13 0553 08/05/13 0745      Assessment/Plan: s/p Procedure(s): Laparoscopy Diagnostic    GASTROSTOMY (PEG) PLACEMENT Stable  postoperatively. Patient had a lot of questions regarding operative findings, prognosis and treatment plan. I tried to answer these as best I could. Mobilize today.    LOS: 1 day    Sartaj Hoskin T 08/06/2013

## 2013-08-07 LAB — CBC
Hemoglobin: 13.9 g/dL (ref 13.0–17.0)
MCH: 33.7 pg (ref 26.0–34.0)
MCHC: 34.6 g/dL (ref 30.0–36.0)
Platelets: 461 10*3/uL — ABNORMAL HIGH (ref 150–400)
RDW: 13.6 % (ref 11.5–15.5)

## 2013-08-07 LAB — BASIC METABOLIC PANEL
CO2: 29 mEq/L (ref 19–32)
Calcium: 9.3 mg/dL (ref 8.4–10.5)
GFR calc Af Amer: >90 mL/min (ref >90)
GFR calc non Af Amer: >90 mL/min (ref >90)
Glucose, Bld: 172 mg/dL — ABNORMAL HIGH (ref 70–99)
Sodium: 136 mEq/L (ref 135–145)

## 2013-08-07 LAB — GLUCOSE, CAPILLARY
Glucose-Capillary: 148 mg/dL — ABNORMAL HIGH (ref 70–99)
Glucose-Capillary: 152 mg/dL — ABNORMAL HIGH (ref 70–99)

## 2013-08-07 MED ORDER — OSMOLITE 1.5 CAL PO LIQD
1000.0000 mL | ORAL | Status: DC
  Administered 2013-08-07 – 2013-08-08 (×2): 1000 mL
  Filled 2013-08-07 (×4): qty 1000

## 2013-08-07 NOTE — Progress Notes (Signed)
Patient ID: Evan Peterson, male   DOB: 1961-07-28, 52 y.o.   MRN: [id number] 2 Days Post-Op  Subjective: Feels better this morning, less pain. Tube feeding near goal at 50 cc per hour and tolerating this without nausea or bloating. Would like a little more to eat  Objective: Vital signs in last 24 hours: Temp:  [98.5 F (36.9 C)-98.6 F (37 C)] 98.5 F (36.9 C) (12/21 0700) Pulse Rate:  [88-121] 121 (12/21 0700) Resp:  [12-18] 18 (12/21 0700) BP: (113-121)/(71-79) 113/71 mmHg (12/21 0700) SpO2:  [90 %-99 %] 94 % (12/21 0700) FiO2 (%):  [97 %] 97 % (12/20 0752) Weight:  [111 lb 5.3 oz (50.5 kg)] 111 lb 5.3 oz (50.5 kg) (12/21 0700)    Intake/Output from previous day: 12/20 0701 - 12/21 0700 In: 3355.8 [I.V.:2400; NG/GT:955.8] Out: 950 [Urine:950] Intake/Output this shift:    General appearance: alert, cooperative and no distress GI: normal findings: soft, non-tender Incision/Wound: dressing clean and dry  Lab Results:   Recent Labs  08/06/13 0550 08/07/13 0541  WBC 15.1* 11.2*  HGB 12.9* 13.9  HCT 36.7* 40.2  PLT 478* 461*   BMET  Recent Labs  08/06/13 0550 08/07/13 0541  NA 135 136  K 4.4 4.0  CL 96 100  CO2 31 29  GLUCOSE 131* 172*  BUN 8 7  CREATININE 0.78 0.68  CALCIUM 8.9 9.3     Studies/Results: No results found.  Anti-infectives: Anti-infectives   Start     Dose/Rate Route Frequency Ordered Stop   08/06/13 0800  ertapenem (INVANZ) 1 g in sodium chloride 0.9 % 50 mL IVPB     1 g 100 mL/hr over 30 Minutes Intravenous Every 24 hours 08/05/13 1202 08/06/13 0834   08/05/13 0553  ertapenem (INVANZ) 1 g in sodium chloride 0.9 % 50 mL IVPB     1 g 100 mL/hr over 30 Minutes Intravenous On call to O.R. 08/05/13 0553 08/05/13 0745      Assessment/Plan: s/p Procedure(s): Laparoscopy Diagnostic    GASTROSTOMY (PEG) PLACEMENT Stable. Advancing tube feedings to goal. Offfer full liquid diet.    LOS: 2 days    Meredith Kilbride T 08/07/2013

## 2013-08-07 NOTE — Progress Notes (Signed)
Dr. Johna Sheriff aware on am rounds pt c/o N/V. Vomited 100 ml brown emesis. See new orders entered by MD.

## 2013-08-08 ENCOUNTER — Encounter (HOSPITAL_COMMUNITY): Payer: Self-pay | Admitting: General Surgery

## 2013-08-08 LAB — GLUCOSE, CAPILLARY
Glucose-Capillary: 129 mg/dL — ABNORMAL HIGH (ref 70–99)
Glucose-Capillary: 142 mg/dL — ABNORMAL HIGH (ref 70–99)
Glucose-Capillary: 147 mg/dL — ABNORMAL HIGH (ref 70–99)
Glucose-Capillary: 170 mg/dL — ABNORMAL HIGH (ref 70–99)

## 2013-08-08 LAB — CBC
HCT: 38.8 % — ABNORMAL LOW (ref 39.0–52.0)
Hemoglobin: 12.8 g/dL — ABNORMAL LOW (ref 13.0–17.0)
MCH: 32.2 pg (ref 26.0–34.0)
MCV: 97.7 fL (ref 78.0–100.0)
RBC: 3.97 MIL/uL — ABNORMAL LOW (ref 4.22–5.81)
RDW: 13.1 % (ref 11.5–15.5)

## 2013-08-08 LAB — BASIC METABOLIC PANEL
BUN: 11 mg/dL (ref 6–23)
CO2: 27 mEq/L (ref 19–32)
Calcium: 8.7 mg/dL (ref 8.4–10.5)
Glucose, Bld: 168 mg/dL — ABNORMAL HIGH (ref 70–99)
Potassium: 4.5 mEq/L (ref 3.5–5.1)
Sodium: 131 mEq/L — ABNORMAL LOW (ref 135–145)

## 2013-08-08 MED ORDER — OSMOLITE 1.5 CAL PO LIQD
1000.0000 mL | ORAL | Status: DC
  Administered 2013-08-09: 1000 mL
  Filled 2013-08-08 (×5): qty 1000

## 2013-08-08 MED ORDER — MORPHINE SULFATE 2 MG/ML IJ SOLN
2.0000 mg | INTRAMUSCULAR | Status: DC | PRN
  Administered 2013-08-09 – 2013-08-10 (×3): 2 mg via INTRAVENOUS
  Filled 2013-08-08 (×4): qty 1

## 2013-08-08 MED ORDER — OXYCODONE HCL 5 MG/5ML PO SOLN
5.0000 mg | ORAL | Status: DC | PRN
  Administered 2013-08-08 (×2): 10 mg via ORAL
  Administered 2013-08-09: 5 mg via ORAL
  Administered 2013-08-09 – 2013-08-10 (×5): 10 mg via ORAL
  Filled 2013-08-08 (×9): qty 10

## 2013-08-08 NOTE — Progress Notes (Addendum)
NUTRITION FOLLOW UP  Intervention:   - Continue TF of Osmolite 1.5 @ 60 ml/hr via PEJ and turn off at 0700 tomorrow morning. Starting 12/23 at 1900, will initiate TF of Osmolite 1.5 at 62ml/hr via PEJ and increase by 10ml every 4 hours to goal rate of 16ml/hr and run until 0700 (7pm-7am). Discussed with nursing. At goal rate, tube feeding regimen will provide 1440 kcal, 60 grams of protein, and 732 ml of H2O and meet 100% of estimated calorie needs, 86% estimated protein needs. If IVF d/c, recommend water flushes 6 times/day.  - Will continue to monitor    Nutrition Dx:   Inadequate oral intake related to n/v as evidenced by unintentional wt loss of 10 lbs over two weeks, PO intake <75% of est nutr needs - ongoing   Goal:   TF + PO intake to meet >/= 90% of their estimated nutrition needs - met currently with TF of Osmolite 1.5 at 23ml/hr  Monitor:   Weights, labs, intake, nausea/vomiting, TF tolerance/advancement  Assessment:   Patient is a 52 year old male who presents with approximately one year of abdominal pain. He has had progressively decreasing appetite over this time and has started to have nausea and vomiting. He sometimes will throw up in the morning what he ate the night before. He denies any bloody stools. He has lost approximately 10 pounds. He is from [place] but has been here for 30 years. He also has had some issues with diarrhea and some concentrated urine. He was evaluated by Dr. Bosie Clos and was found on endoscopy to have a mass on the lesser curvature of the stomach. 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 He has had a noncontrasted CT scan which is negative for metastatic disease. He was taking NSAIDs previously but stopped these around 9 months ago. He did not have any improvement in symptoms since then. Since his diagnosis, he has been taking Percocet at night. He has continued to work as a Psychologist, occupational during the day. He continues to feel weak  because of his difficulty with his diet.   -Pt underwent PEJ tube placement on 08/05/2013 d/t inability to meet est nutr needs via PO intake  -Pt was seen by outpt RD on 07/29/2013. Weight was 106 lbs, has decreased 4 lbs in one week.  -Per discussion with pt and pt's wife, pt has lost approx 10 lbs over a two week period. Has not been able to consume solid foods d/t n/v/abd pain. Diet has largely consisted of Ensure shakes, which pt would consume 1-2 daily.  -Discussed pt with MD. MD requesting pt receive EN nocturnal feeds to meet > 75% est nutr needs as pt will likely continue with minimal PO intake. Requested EN to be started on 08/05/2013, with diet advancement to full liquids within 1-2 days. - Pt had nausea/vomiting 08/07/2013 with pt vomiting brown emesis. Met with pt who reports eating only 3 spoonfuls of cream of chicken soup this morning. Had some vomiting this morning which pt reports was clear. Pt c/o some nausea. RN thinks pt's morphine contributing to nausea/vomiting. During visit, pt had small amount of dark brown/green spit up, notified RN.   Current TF:  Osmolite 1.5 at 22ml/hr via J tube which provides 2160 calories, 90g protein, free water which meets 135% estimated calorie needs and 100% estimated protein needs  Height: Ht Readings from Last 1 Encounters:  08/05/13 5\' 3"  (1.6 m)    Weight Status:   Wt Readings from  Last 1 Encounters:  08/08/13 111 lb 5.3 oz (50.5 kg)  Admit wt:        102 lb 3.2 oz (46.3 kg)  Net I/Os: +4.9L  Re-estimated needs:  Kcal: 1400-1600 kcal  Protein: 70-90 grams  Fluid: 1600 ml/daily   Skin: WNL  Diet Order: Full Liquid   Intake/Output Summary (Last 24 hours) at 08/08/13 1248 Last data filed at 08/08/13 1000  Gross per 24 hour  Intake   4000 ml  Output   1450 ml  Net   2550 ml    Last BM: PTA   Labs:   Recent Labs Lab 08/06/13 0550 08/07/13 0541 08/08/13 0412  NA 135 136 131*  K 4.4 4.0 4.5  CL 96 100 94*   CO2 31 29 27   BUN 8 7 11   CREATININE 0.78 0.68 0.71  CALCIUM 8.9 9.3 8.7  GLUCOSE 131* 172* 168*    CBG (last 3)   Recent Labs  08/08/13 0405 08/08/13 0813 08/08/13 1219  GLUCAP 170* 129* 142*    Scheduled Meds: . heparin  5,000 Units Subcutaneous Q8H  . morphine   Intravenous Q4H  . ondansetron  4 mg Oral Q6H    Continuous Infusions: . dextrose 5 % and 0.45 % NaCl with KCl 20 mEq/L 20 mL/hr at 08/08/13 0919  . feeding supplement (OSMOLITE 1.5 CAL) 1,000 mL (08/08/13 1026)    Levon Hedger MS, RD, LDN [phone] Pager [phone] After Hours Pager

## 2013-08-08 NOTE — Progress Notes (Signed)
Patient ID: Evan Peterson, male   DOB: 18-Mar-1961, 52 y.o.   MRN: [id number] 3 Days Post-Op  Subjective: Feels better this morning, pain controlled. Tube feeding near goal at 60 cc per hour, has occasional nausea. Eating very little PO  Objective: Vital signs in last 24 hours: Temp:  [98.4 F (36.9 C)-99.6 F (37.6 C)] 98.5 F (36.9 C) (12/22 0545) Pulse Rate:  [104-114] 104 (12/22 0545) Resp:  [12-18] 16 (12/22 1139) BP: (107-135)/(69-86) 110/69 mmHg (12/22 0545) SpO2:  [96 %-100 %] 97 % (12/22 1139) Weight:  [111 lb 5.3 oz (50.5 kg)] 111 lb 5.3 oz (50.5 kg) (12/22 0500)    Intake/Output from previous day: 12/21 0701 - 12/22 0700 In: 3680 [P.O.:80; I.V.:2400; NG/GT:1200] Out: 1750 [Urine:1650; Emesis/NG output:100] Intake/Output this shift: Total I/O In: 400 [I.V.:400] Out: 200 [Urine:200]  General appearance: alert, cooperative and no distress GI: normal findings: soft, non-tender, non-distended Incision/Wound:clean and dry  Lab Results:   Recent Labs  08/07/13 0541 08/08/13 0412  WBC 11.2* 10.1  HGB 13.9 12.8*  HCT 40.2 38.8*  PLT 461* 460*   BMET  Recent Labs  08/07/13 0541 08/08/13 0412  NA 136 131*  K 4.0 4.5  CL 100 94*  CO2 29 27  GLUCOSE 172* 168*  BUN 7 11  CREATININE 0.68 0.71  CALCIUM 9.3 8.7     Studies/Results: No results found.  Anti-infectives: Anti-infectives   Start     Dose/Rate Route Frequency Ordered Stop   08/06/13 0800  ertapenem (INVANZ) 1 g in sodium chloride 0.9 % 50 mL IVPB     1 g 100 mL/hr over 30 Minutes Intravenous Every 24 hours 08/05/13 1202 08/06/13 0834   08/05/13 0553  ertapenem (INVANZ) 1 g in sodium chloride 0.9 % 50 mL IVPB     1 g 100 mL/hr over 30 Minutes Intravenous On call to O.R. 08/05/13 0553 08/05/13 0745      Assessment/Plan: s/p Procedure(s): Laparoscopy Diagnostic    GASTROSTOMY (PEG) PLACEMENT Stable. Advancing tube feedings to goal. Cont full liquid diet.  Establish home health for tube  feeds.  D/C PCA, PO liquid Roxicodone for pain   LOS: 3 days    Treylen Gibbs C. 08/08/2013

## 2013-08-08 NOTE — Progress Notes (Signed)
Utilization review completed.  

## 2013-08-08 NOTE — Progress Notes (Signed)
Went in to assess patient at 19:45 and continuous feeding pump was beeping, saying "clogged line". I checked the PEG tube and tried to irrigate as well as pull back on syringe for any residual, and met strong resistance with each try. Called Dr. Ezzard Standing to make aware, and was told to clamp tube for the rest of the night, and to make sure rounding MD in the AM addresses this need. Patient aware. VSS.

## 2013-08-09 LAB — CBC
HCT: 37.7 % — ABNORMAL LOW (ref 39.0–52.0)
Hemoglobin: 13.1 g/dL (ref 13.0–17.0)
MCH: 33.3 pg (ref 26.0–34.0)
MCHC: 34.7 g/dL (ref 30.0–36.0)
MCV: 95.9 fL (ref 78.0–100.0)
RDW: 13 % (ref 11.5–15.5)

## 2013-08-09 LAB — GLUCOSE, CAPILLARY
Glucose-Capillary: 114 mg/dL — ABNORMAL HIGH (ref 70–99)
Glucose-Capillary: 145 mg/dL — ABNORMAL HIGH (ref 70–99)

## 2013-08-09 LAB — BASIC METABOLIC PANEL
BUN: 12 mg/dL (ref 6–23)
Calcium: 8.9 mg/dL (ref 8.4–10.5)
Creatinine, Ser: 0.73 mg/dL (ref 0.50–1.35)
GFR calc Af Amer: >90 mL/min (ref >90)
GFR calc non Af Amer: >90 mL/min (ref >90)
Potassium: 4.2 mEq/L (ref 3.5–5.1)

## 2013-08-09 NOTE — Progress Notes (Signed)
Dr. Maisie Fus in to see pt.  [employer] ordered to attempt peg tube unclogging.  Unclogging worked at first attempt.  Tube feeding restarted @ 65cc/hr.

## 2013-08-09 NOTE — Care Management Note (Signed)
    Page 1 of 2   08/09/2013     1:10:48 PM   CARE MANAGEMENT NOTE 08/09/2013  Patient:  Evan Peterson, Evan Peterson   Account Number:  [id number]  Date Initiated:  08/08/2013  Documentation initiated by:  Colleen Can  Subjective/Objective Assessment:   dx gastric cancer     Action/Plan:   Pt is from Home   Anticipated DC Date:  08/10/2013   Anticipated DC Plan:  HOME W HOME HEALTH SERVICES      DC Planning Services  CM consult      [facility] Choice  DURABLE MEDICAL EQUIPMENT  HOME HEALTH   Choice offered to / List presented to:  C-1 Patient   DME arranged  TUBE FEEDING  TUBE FEEDING PUMP      DME agency  [facility]     [facility] arranged  HH-1 RN  HH-10 DISEASE MANAGEMENT      HH agency  [facility]   Status of service:  Completed, signed off Medicare Important Message given?   (If response is "NO", the following Medicare IM given date fields will be blank) Date Medicare IM given:   Date Additional Medicare IM given:    Discharge Disposition:  HOME W HOME HEALTH SERVICES  Per UR Regulation:  Reviewed for med. necessity/level of care/duration of stay  If discussed at Long Length of Stay Meetings, dates discussed:    Comments:

## 2013-08-09 NOTE — Progress Notes (Signed)
Patient ID: EDSON DERIDDER, male   DOB: 01-04-1961, 52 y.o.   MRN: [id number] 4 Days Post-Op  Subjective: Feels better, pain controlled with PO meds. Tube feeding at goal yesterday, but J tube now clogged  Objective: Vital signs in last 24 hours: Temp:  [97.9 F (36.6 C)-99.4 F (37.4 C)] 97.9 F (36.6 C) (12/23 0559) Pulse Rate:  [96-115] 96 (12/23 0559) Resp:  [15-18] 18 (12/23 0559) BP: (111-117)/(71-79) 111/75 mmHg (12/23 0559) SpO2:  [92 %-99 %] 93 % (12/23 0559) Weight:  [107 lb 12.9 oz (48.9 kg)] 107 lb 12.9 oz (48.9 kg) (12/23 0601)    Intake/Output from previous day: 12/22 0701 - 12/23 0700 In: 633.7 [I.V.:633.7] Out: 1350 [Urine:1350] Intake/Output this shift:    General appearance: alert, cooperative and no distress GI: normal findings: soft, non-tender, non-distended Incision/Wound:clean and dry  Lab Results:   Recent Labs  08/08/13 0412 08/09/13 0547  WBC 10.1 12.4*  HGB 12.8* 13.1  HCT 38.8* 37.7*  PLT 460* 558*   BMET  Recent Labs  08/08/13 0412 08/09/13 0547  NA 131* 132*  K 4.5 4.2  CL 94* 93*  CO2 27 27  GLUCOSE 168* 113*  BUN 11 12  CREATININE 0.71 0.73  CALCIUM 8.7 8.9     Studies/Results: No results found.  Anti-infectives: Anti-infectives   Start     Dose/Rate Route Frequency Ordered Stop   08/06/13 0800  ertapenem (INVANZ) 1 g in sodium chloride 0.9 % 50 mL IVPB     1 g 100 mL/hr over 30 Minutes Intravenous Every 24 hours 08/05/13 1202 08/06/13 0834   08/05/13 0553  ertapenem (INVANZ) 1 g in sodium chloride 0.9 % 50 mL IVPB     1 g 100 mL/hr over 30 Minutes Intravenous On call to O.R. 08/05/13 0553 08/05/13 0745      Assessment/Plan: s/p Procedure(s): Laparoscopy Diagnostic    GASTROSTOMY (PEG) PLACEMENT Stable.  Cont full liquid diet.  Establish home health for tube feeds.   Will attempt to unclog J tube today.  If tube is functional tonight, will start nocturnal tube feeds tonight.  If tolerates nocturnal feeds, will  d/c in AM    LOS: 4 days    Cardelia Sassano C. 08/09/2013

## 2013-08-10 LAB — GLUCOSE, CAPILLARY
Glucose-Capillary: 128 mg/dL — ABNORMAL HIGH (ref 70–99)
Glucose-Capillary: 145 mg/dL — ABNORMAL HIGH (ref 70–99)
Glucose-Capillary: 147 mg/dL — ABNORMAL HIGH (ref 70–99)

## 2013-08-10 MED ORDER — OXYCODONE HCL 5 MG/5ML PO SOLN: 5.0000 mg | ORAL | Status: DC | PRN

## 2013-08-10 MED ORDER — OSMOLITE 1.5 CAL PO LIQD: 1000.0000 mL | ORAL | Status: DC

## 2013-08-10 NOTE — Discharge Summary (Signed)
Physician Discharge Summary  Patient ID: Evan Peterson MRN: [id number] DOB/AGE: [id number] 53 y.o.  Admit date: 08/05/2013 Discharge date: 08/10/2013  Admission Diagnoses: abd pain, anorexia, nausea and vomiting   Discharge Diagnoses:  Active Problems:   Gastric cancer, Stage 4, metastatic   Protein-calorie malnutrition, severe   Jejunostomy tube in place   Discharged Condition: stable  Hospital Course: Patient admitted and found to have gastric cancer.  Metastatic workup was negative.  He was taken to the OR for exploration and possible resection.  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  The resection was aborted and a J tube was placed.  The patient was transitioned to tube feeds per dietary recommendations.  He was then transitioned to nocturnal tube feeds without difficulty.  His pain was controlled with PO narcotics and he was determined to be in stable condition for d/c home on POD 5.  Consults: None  Significant Diagnostic Studies: labs: cbc, chemistry   Treatments: IV hydration, analgesia: acetaminophen w/ codeine, surgery: see above and tube feeds  Discharge Exam: Blood pressure 108/72, pulse 91, temperature 98 F (36.7 C), temperature source Oral, resp. rate 18, height 5\' 3"  (1.6 m), weight 102 lb 11.8 oz (46.6 kg), SpO2 93.00%. General appearance: alert and cooperative GI: normal findings: soft, non-tender Incision/Wound: clean, dry J tube intact and functioning   Disposition: 01-Home or Self Care   Future Appointments Provider Department Dept Phone   08/23/2013 1:30 PM [facility] [phone]   08/23/2013 2:00 PM Ladene Artist, MD [facility] [phone]       Medication List    STOP taking these medications       oxyCODONE-acetaminophen 5-325 MG per tablet  Commonly known as:  ROXICET      TAKE these medications       feeding  supplement (OSMOLITE 1.5 CAL) Liqd  Place 1,000 mLs into feeding tube continuous. Run from 7pm-7am at 80 ml/hr (12 hour continuous nocturnal feed).  If patient does not tolerate this, may run from 5pm to 9am at 24ml/h.     ondansetron 4 MG tablet  Commonly known as:  ZOFRAN  Take 1 tablet (4 mg total) by mouth every 6 (six) hours.     oxyCODONE 5 MG/5ML solution  Commonly known as:  ROXICODONE  Take 5-10 mLs (5-10 mg total) by mouth every 4 (four) hours as needed for moderate pain or severe pain.           Follow-up Information   Follow up with [facility], MD. Schedule an appointment as soon as possible for a visit in 2 weeks.   Specialty:  General Surgery   Contact information:   [address] [place] [address] [phone]       Signed: Vanita Panda 08/10/2013, 9:34 AM

## 2013-08-10 NOTE — Discharge Instructions (Signed)
ABDOMINAL SURGERY: POST OP INSTRUCTIONS  1. DIET: Follow a light bland diet the first 24 hours after arrival home, such as soup, liquids, crackers, etc.  Be sure to include lots of fluids daily.  Avoid fast food or heavy meals as your are more likely to get nauseated.  Eat a low fat the next few days after surgery.   2. Tube Feeding: Run tube feeds from 7pm to 7am at 36ml/h.  If this is not tolerated, can run tube feeds from 5pm to 9am at 55ml/h.  Flush tube with water flushes 6 times/day and especially after stopping tube feeds.  Do not put crushed meds down the J tube.  3. Take your usually prescribed home medications unless otherwise directed. 4. PAIN CONTROL: a. Pain is best controlled by a usual combination of three different methods TOGETHER: i. Ice/Heat ii. Over the counter pain medication iii. Prescription pain medication b. Most patients will experience some swelling and bruising around the incisions.  Ice packs or heating pads (30-60 minutes up to 6 times a day) will help. Use ice for the first few days to help decrease swelling and bruising, then switch to heat to help relax tight/sore spots and speed recovery.  Some people prefer to use ice alone, heat alone, alternating between ice & heat.  Experiment to what works for you.  Swelling and bruising can take several weeks to resolve.   c. It is helpful to take an over-the-counter pain medication regularly for the first few weeks.  Choose one of the following that works best for you: i. Naproxen (Aleve, etc)  Two 220mg  tabs twice a day ii. Ibuprofen (Advil, etc) Three 200mg  tabs four times a day (every meal & bedtime) iii. Acetaminophen (Tylenol, etc) 500-650mg  four times a day (every meal & bedtime) d. A  prescription for pain medication (such as oxycodone, hydrocodone, etc) should be given to you upon discharge.  Take your pain medication as prescribed.  i. If you are having problems/concerns with the prescription medicine (does not  control pain, nausea, vomiting, rash, itching, etc), please call us [phone] to see if we need to switch you to a different pain medicine that will work better for you and/or control your side effect better. ii. If you need a refill on your pain medication, please contact your pharmacy.  They will contact our office to request authorization. Prescriptions will not be filled after 5 pm or on week-ends. 5. Avoid getting constipated.  Between the surgery and the pain medications, it is common to experience some constipation.  Increasing fluid intake and taking a fiber supplement (such as Metamucil, Citrucel, FiberCon, MiraLax, etc) 1-2 times a day regularly will usually help prevent this problem from occurring.  A mild laxative (prune juice, Milk of Magnesia, MiraLax, etc) should be taken according to package directions if there are no bowel movements after 48 hours.   6. Watch out for diarrhea.  If you have many loose bowel movements, simplify your diet to bland foods & liquids for a few days.  Stop any stool softeners and decrease your fiber supplement.  Switching to mild anti-diarrheal medications (Kayopectate, Pepto Bismol) can help.  If this worsens or does not improve, please call us. 7. Wash / shower every day.  You may shower over the incision / wound.  Avoid baths until the skin is fully healed.  Continue to shower over incision(s) after the dressing is off. 8. Remove your waterproof bandages 5 days after surgery.  You  may leave the incision open to air.  You may replace a dressing/Band-Aid to cover the incision for comfort if you wish. 9. ACTIVITIES as tolerated:   a. You may resume regular (light) daily activities beginning the next day--such as daily self-care, walking, climbing stairs--gradually increasing activities as tolerated.  If you can walk 30 minutes without difficulty, it is safe to try more intense activity such as jogging, treadmill, bicycling, low-impact aerobics, swimming,  etc. b. Save the most intensive and strenuous activity for last such as sit-ups, heavy lifting, contact sports, etc  Refrain from any heavy lifting or straining until you are off narcotics for pain control.   c. DO NOT PUSH THROUGH PAIN.  Let pain be your guide: If it hurts to do something, don't do it.  Pain is your body warning you to avoid that activity for another week until the pain goes down. d. You may drive when you are no longer taking prescription pain medication, you can comfortably wear a seatbelt, and you can safely maneuver your car and apply brakes. e. Bonita Quin may have sexual intercourse when it is comfortable.  10. FOLLOW UP in our office a. Please call [facility] at [phone] to set up an appointment to see your surgeon in the office for a follow-up appointment approximately 1-2 weeks after your surgery. b. Make sure that you call for this appointment the day you arrive home to insure a convenient appointment time. 10. IF YOU HAVE DISABILITY OR FAMILY LEAVE FORMS, BRING THEM TO THE OFFICE FOR PROCESSING.  DO NOT GIVE THEM TO YOUR DOCTOR.   WHEN TO CALL us [phone]: 1. Poor pain control 2. Reactions / problems with new medications (rash/itching, nausea, etc)  3. Fever over 101.5 F (38.5 C) 4. Inability to urinate 5. Nausea and/or vomiting 6. Worsening swelling or bruising 7. Continued bleeding from incision. 8. Increased pain, redness, or drainage from the incision  The clinic staff is available to answer your questions during regular business hours (8:30am-5pm).  Please dont hesitate to call and ask to speak to one of our nurses for clinical concerns.   A surgeon from [facility] is always on call at the hospitals   If you have a medical emergency, go to the nearest emergency room or call 911.    [facility] [address], [place], [place]  [address] ? MAIN: [phone] ? TOLL FREE: [phone] ? FAX [phone]  [phone] [email or web address]

## 2013-08-16 ENCOUNTER — Telehealth (INDEPENDENT_AMBULATORY_CARE_PROVIDER_SITE_OTHER): Payer: Self-pay | Admitting: *Deleted

## 2013-08-16 NOTE — Telephone Encounter (Signed)
Dr. Donell Beers responded that, that was fine to approve.  I called Evan Peterson to update her.  She states understanding at this time.

## 2013-08-16 NOTE — Telephone Encounter (Signed)
Evan Peterson with [facility] called to ask for a verbal order for social work consult.  Explained that I would send the message to Dr. Donell Beers to ask her opinion.  She states understanding and agreeable at this time.

## 2013-08-16 NOTE — Telephone Encounter (Signed)
Routing to Dr. Donell Beers

## 2013-08-17 ENCOUNTER — Telehealth: Payer: Self-pay | Admitting: *Deleted

## 2013-08-17 NOTE — Telephone Encounter (Signed)
Confirmed appointment with Dr. Truett Perna for 08/23/13.

## 2013-08-23 ENCOUNTER — Encounter: Payer: Self-pay | Admitting: Oncology

## 2013-08-23 ENCOUNTER — Ambulatory Visit: Payer: BC Managed Care – PPO | Admitting: Nutrition

## 2013-08-23 ENCOUNTER — Ambulatory Visit (HOSPITAL_BASED_OUTPATIENT_CLINIC_OR_DEPARTMENT_OTHER): Payer: BC Managed Care – PPO | Admitting: Oncology

## 2013-08-23 ENCOUNTER — Telehealth: Payer: Self-pay | Admitting: Oncology

## 2013-08-23 ENCOUNTER — Ambulatory Visit: Payer: BC Managed Care – PPO

## 2013-08-23 VITALS — BP 98/70 | HR 88 | Temp 97.0°F | Resp 18 | Ht 63.0 in | Wt 95.6 lb

## 2013-08-23 DIAGNOSIS — G893 Neoplasm related pain (acute) (chronic): Secondary | ICD-10-CM

## 2013-08-23 DIAGNOSIS — Z934 Other artificial openings of gastrointestinal tract status: Secondary | ICD-10-CM

## 2013-08-23 DIAGNOSIS — C772 Secondary and unspecified malignant neoplasm of intra-abdominal lymph nodes: Secondary | ICD-10-CM

## 2013-08-23 DIAGNOSIS — C785 Secondary malignant neoplasm of large intestine and rectum: Secondary | ICD-10-CM

## 2013-08-23 DIAGNOSIS — C169 Malignant neoplasm of stomach, unspecified: Secondary | ICD-10-CM

## 2013-08-23 DIAGNOSIS — R630 Anorexia: Secondary | ICD-10-CM

## 2013-08-23 DIAGNOSIS — C165 Malignant neoplasm of lesser curvature of stomach, unspecified: Secondary | ICD-10-CM

## 2013-08-23 DIAGNOSIS — E43 Unspecified severe protein-calorie malnutrition: Secondary | ICD-10-CM

## 2013-08-23 MED ORDER — OSMOLITE 1.5 CAL PO LIQD
ORAL | Status: DC
Start: 2013-08-23 — End: 2013-09-07

## 2013-08-23 MED ORDER — OXYCODONE HCL 5 MG/5ML PO SOLN: 5.0000 mg | ORAL | Status: DC | PRN

## 2013-08-23 NOTE — Progress Notes (Signed)
Followup completed with patient in the exam room.  Patient has been tolerating Osmolite 1.5 at 80 mL an hour for 12 hours daily through jejunostomy tube for approximately one week.  Patient states he flushes his feeding tube with 120 mL free water before and after continuous feedings.  He denies problems with tolerance of tube feedings.  Patient reports he is eating very little during the day.  His weight loss has continued and weight was documented as 95 pounds today, January 6 from 106 pounds on December 5.  Patient reports consuming clear liquid supplements with good tolerance.  Patient reports being unable to find these in a store.  Estimated nutrition needs for repletion: 1500-1800 calories, 65-77 g protein, 1.8 L fluid.  Current tube feedings of Osmolite 1.5 four cans daily provides 1420 calories, 60 g protein, and 732 mL free water are inadequate to meet needs for repletion.  Nutrition diagnosis: Unintended weight loss continues.    Intervention: Patient was educated to increase continuous feedings of Osmolite 1.5 from 12 hours daily to 14 hours daily.  Rate of tube feedings should remain at 80 mL an hour.  This will provide 5 cans daily for 1775 calories, 75 g protein, 1785 mL free water. Patient should flush feeding tube with 120 mL free water before and after continuous feedings.  In addition, patient should give 160 mL free water 4 times a day daily.  Patient to consume oral intake per physician recommendations.  Information provided on where to find clear liquid supplements.  Questions were answered.  Written instructions provided.  Teach back method used.  Monitoring, evaluation, goals: Patient will tolerate increase in jejunostomy feedings to minimize further weight loss and promote weight gain.  Oral intake per M.D. as tolerated.  Next visit: Will continue to follow patient as needed.  Patient has my contact information.

## 2013-08-23 NOTE — Progress Notes (Signed)
Met with Rc Kennis Carina and family. Explained role of nurse navigator. Educational information provided on stomach cancer  Referral made to dietician for diet education and he was seen today. [place] resources provided to patient, including SW service information.  SW referral made to contact patient to assess progress of disability application and assess for other possible needs.  Contact names and phone numbers were provided for entire [facility] team.  Teach back method was used.  Patient to RTC in 2 weeks and make treatment decision.   Will continue to follow as needed.

## 2013-08-23 NOTE — Progress Notes (Signed)
[facility] New Patient Consult   Referring WU:[id number] Baran Kuhrt 53 y.o.  Oct 04, 1960    Reason for Referral: Gastric cancer     HPI: He has a one-year history of abdominal pain. When he developed nausea and vomiting he was referred to Dr. Michail Sermon. An endoscopy confirmed a mass on the lesser curvature of the stomach. A biopsy confirmed adenocarcinoma.  He was referred to Dr. Barry Dienes. A CT of the abdomen and pelvis on 07/18/2013. Wall thickening was noted at the mid to distal body of the stomach and gastric antrum. The liver, pancreas, and adrenal glands were negative on this noncontrast study. Nonobstructive bowel gas pattern.  A PET scan 08/03/2013 revealed diffuse hypermetabolic activity throughout the gastric wall with numerous enlarged hypermetabolic lymph nodes in the upper abdomen. No abnormal hypermetabolic activity in the liver, no significant ascites.  He was taken to the operating room on 08/05/2013 and underwent a diagnostic laparoscopy, mesenteric biopsies, peritoneal biopsies, and placement of a jejunostomy tube. Multiple duodenal wall nodules were noted consistent with carcinomatosis. Multiple small bowel mesenteric nodules were seen. A small amount of ascites in the suprahepatic location was sent for cytology. The stomach was adherent to the retroperitoneum. Multiple lymph nodes were seen in the transverse colon mesentery. A frozen section from a transverse colon lymph node returned positive for metastatic adenocarcinoma. The planned gastrectomy was aborted.  The pathology [id number]) revealed no malignancy involving biopsies of a small bowel nodule and the peritoneum. The transverse colon lymph node contained metastatic poorly differentiated adenocarcinoma. A small bowel mesentery nodule also was involved by poorly differentiated adenocarcinoma. The cytology from peritoneal washings revealed atypical cells favored to represent reactive mesothelial  cells. He continues to have abdominal pain and nausea. He is tolerating tube feedings.   Past Medical History  Diagnosis Date  . Cancer-stage IV   08/05/2013     stomach    Past Surgical History  Procedure Laterality Date  . Upper gi endoscopy   07/12/2013   .  N/A 08/05/2013    Procedure: Laparoscopy Diagnostic  ;  Surgeon: Stark Klein, MD;  Location: [facility];  Service: General;  Laterality: N/A;  .  jejunostomy tube placement  N/A 08/05/2013    Procedure:  Jejunostomy tube;  Surgeon: Stark Klein, MD;  Location: [facility]S;  Service: General;  Laterality: N/A;    Family history: 11 siblings. One son and one daughter. No family history of cancer.   Current outpatient prescriptions:Nutritional Supplements (FEEDING SUPPLEMENT, OSMOLITE 1.5 CAL,) LIQD, Place 1,000 mLs into feeding tube continuous. Run from 7pm-7am at 80 ml/hr (12 hour continuous nocturnal feed).  If patient does not tolerate this, may run from 5pm to 9am at 8m/h., Disp: 30 Bottle, Rfl: 0;  ondansetron (ZOFRAN) 4 MG tablet, Take 1 tablet (4 mg total) by mouth every 6 (six) hours., Disp: 60 tablet, Rfl: 0 oxyCODONE (ROXICODONE) 5 MG/5ML solution, Take 5-10 mLs (5-10 mg total) by mouth every 4 (four) hours as needed for moderate pain or severe pain., Disp: 300 mL, Rfl: 0  Allergies:  Allergies  Allergen Reactions  . Asa [Aspirin] Nausea And Vomiting  . Clarithromycin Other (See Comments)    More pain and nausea    Social History: He lives with his wife and children. He works as a wBuilding control surveyor He reports smoking one pack of cigarettes per day for 30 years. No alcohol use. No risk factor for HIV or hepatitis.  ROS:   Positives include: 20 pound  weight loss, anorexia, vomiting 2 times per week, constipation, abdominal pain, blurred vision since surgery  A complete ROS was otherwise negative.  Physical Exam:  Blood pressure 98/70, pulse 88, temperature 97 F (36.1 C), temperature source Oral, resp. rate 18, height '5\' 3"'   (1.6 m), weight 95 lb 9.6 oz (43.364 kg).  HEENT: Partial upper and lower plates, oropharynx without visible mass, neck without mass Lungs: Clear bilaterally Cardiac: Regular rate and rhythm Abdomen: Healing midline incision with Steri-Strips in place, left upper quadrant feeding tube site without evidence of infection. Tender in the right upper abdomen. No hepatosplenomegaly, no mass, no apparent ascites GU: Testes without mass  Vascular: No leg edema Lymph nodes: No cervical or supraclavicular nodes. "Shotty "bilateral axillary and inguinal nodes Neurologic: Alert and oriented, the motor exam appears intact in the upper and lower extremities Skin: No rash Musculoskeletal: No spine tenderness   LAB:  CBC  Lab Results  Component Value Date   WBC 12.4* 08/09/2013   HGB 13.1 08/09/2013   HCT 37.7* 08/09/2013   MCV 95.9 08/09/2013   PLT 558* 08/09/2013   NEUTROABS 6.8 08/03/2013     CMP      Component Value Date/Time   NA 132* 08/09/2013 0547   K 4.2 08/09/2013 0547   CL 93* 08/09/2013 0547   CO2 27 08/09/2013 0547   GLUCOSE 113* 08/09/2013 0547   BUN 12 08/09/2013 0547   CREATININE 0.73 08/09/2013 0547   CALCIUM 8.9 08/09/2013 0547   PROT 7.4 08/03/2013 1030   ALBUMIN 3.7 08/03/2013 1030   AST 16 08/03/2013 1030   ALT 11 08/03/2013 1030   ALKPHOS 66 08/03/2013 1030   BILITOT 0.4 08/03/2013 1030   GFRNONAA >90 08/09/2013 0547   GFRAA >90 08/09/2013 0547     Radiology: As per history of present illness    Assessment/Plan:   1. Metastatic gastric cancer, stage IV-status post a diagnostic laparoscopy 08/05/2013 confirming carcinomatosis with biopsies of a transverse colon lymph node and small bowel mesentery nodule confirming metastatic poorly differentiated adenocarcinoma.  Lesser curvature gastric mass confirmed at endoscopy 07/12/2013 with a biopsy revealing poorly differentiated adenocarcinoma  2. Anorexia/weight loss and malnutrition-currently maintained on  jejunostomy tube feedings  3. Pain secondary to gastric cancer   Disposition:   Mr. Kilgallon has been diagnosed with metastatic gastric cancer. I discussed the prognosis and treatment options with him today. He speaks some English and was here today with his English-speaking son and niece. He appeared to understand my explanation of his diagnosis and treatment options.  I explained no therapy will be curative. He is not a candidate for resection of the gastric mass. We discussed comfort care versus a trial of systemic chemotherapy. His initial impression is that he does not wish to be treated with chemotherapy, but he will consider this after further discussion with his family. He is interested in herbal treatment as practiced in [place]. I explained I am not aware of a benefit with herbal treatment of gastric cancer.  He is malnourished and stays in the house/bed most of the day. His performance status is not adequate for chemotherapy at present. Mr. Heemstra met with the [facility] nutritionist today. The tube feedings were increased. We refilled his prescription for oxycodone elixir.  He will return for an office visit and further discussion in 2 weeks. I plan to discuss comfort care versus a trial of FOLFOX when he returns in 2 weeks.  [place], [place] 08/23/2013, 2:31 PM

## 2013-08-23 NOTE — Progress Notes (Signed)
Checked in new pt with no financial concerns. °

## 2013-08-23 NOTE — Telephone Encounter (Signed)
appts made per 01/06 POF AVS and CAL given shh

## 2013-08-24 ENCOUNTER — Telehealth: Payer: Self-pay | Admitting: *Deleted

## 2013-08-24 NOTE — Telephone Encounter (Signed)
Spoke with J. Epps in pathology and requested Her-2 Neu testing on gastric biopsy from 07/12/13 per order from Dr. Benay Spice.

## 2013-08-25 ENCOUNTER — Ambulatory Visit (INDEPENDENT_AMBULATORY_CARE_PROVIDER_SITE_OTHER): Payer: BC Managed Care – PPO | Admitting: General Surgery

## 2013-08-25 ENCOUNTER — Encounter (INDEPENDENT_AMBULATORY_CARE_PROVIDER_SITE_OTHER): Payer: Self-pay | Admitting: General Surgery

## 2013-08-25 VITALS — BP 90/70 | HR 76 | Temp 98.4°F | Resp 14 | Ht 63.0 in | Wt 98.2 lb

## 2013-08-25 DIAGNOSIS — C169 Malignant neoplasm of stomach, unspecified: Secondary | ICD-10-CM

## 2013-08-25 NOTE — Patient Instructions (Signed)
Continue tube feeds.   OK to add herbal medicine to tube feed bag.    I will send message to Barnett Applebaum, the nurse navigator at the cancer center.

## 2013-08-26 ENCOUNTER — Emergency Department (HOSPITAL_COMMUNITY): Payer: BC Managed Care – PPO

## 2013-08-26 ENCOUNTER — Emergency Department (HOSPITAL_COMMUNITY)
Admission: EM | Admit: 2013-08-26 | Discharge: 2013-08-26 | Disposition: A | Discharge status: Home / Self Care | Payer: BC Managed Care – PPO | Attending: Emergency Medicine | Admitting: Emergency Medicine

## 2013-08-26 ENCOUNTER — Encounter (HOSPITAL_COMMUNITY): Payer: Self-pay | Admitting: Emergency Medicine

## 2013-08-26 DIAGNOSIS — R5381 Other malaise: Secondary | ICD-10-CM | POA: Insufficient documentation

## 2013-08-26 DIAGNOSIS — Z79899 Other long term (current) drug therapy: Secondary | ICD-10-CM | POA: Insufficient documentation

## 2013-08-26 DIAGNOSIS — Z9889 Other specified postprocedural states: Secondary | ICD-10-CM | POA: Insufficient documentation

## 2013-08-26 DIAGNOSIS — C801 Malignant (primary) neoplasm, unspecified: Secondary | ICD-10-CM | POA: Insufficient documentation

## 2013-08-26 DIAGNOSIS — N390 Urinary tract infection, site not specified: Secondary | ICD-10-CM

## 2013-08-26 DIAGNOSIS — K644 Residual hemorrhoidal skin tags: Secondary | ICD-10-CM | POA: Insufficient documentation

## 2013-08-26 DIAGNOSIS — Z934 Other artificial openings of gastrointestinal tract status: Secondary | ICD-10-CM

## 2013-08-26 DIAGNOSIS — F17200 Nicotine dependence, unspecified, uncomplicated: Secondary | ICD-10-CM | POA: Insufficient documentation

## 2013-08-26 DIAGNOSIS — C169 Malignant neoplasm of stomach, unspecified: Secondary | ICD-10-CM

## 2013-08-26 DIAGNOSIS — Z792 Long term (current) use of antibiotics: Secondary | ICD-10-CM | POA: Insufficient documentation

## 2013-08-26 DIAGNOSIS — R000 Tachycardia, unspecified: Secondary | ICD-10-CM | POA: Insufficient documentation

## 2013-08-26 DIAGNOSIS — K5900 Constipation, unspecified: Secondary | ICD-10-CM | POA: Insufficient documentation

## 2013-08-26 DIAGNOSIS — C7889 Secondary malignant neoplasm of other digestive organs: Secondary | ICD-10-CM | POA: Insufficient documentation

## 2013-08-26 DIAGNOSIS — R5383 Other fatigue: Secondary | ICD-10-CM

## 2013-08-26 LAB — COMPREHENSIVE METABOLIC PANEL
ALBUMIN: 3.3 g/dL — AB (ref 3.5–5.2)
ALK PHOS: 66 U/L (ref 39–117)
ALT: 40 U/L (ref 0–53)
AST: 27 U/L (ref 0–37)
BILIRUBIN TOTAL: 0.3 mg/dL (ref 0.3–1.2)
BUN: 16 mg/dL (ref 6–23)
CHLORIDE: 97 meq/L (ref 96–112)
CO2: 29 mEq/L (ref 19–32)
Calcium: 8.9 mg/dL (ref 8.4–10.5)
Creatinine, Ser: 0.68 mg/dL (ref 0.50–1.35)
GFR calc non Af Amer: >90 mL/min (ref >90)
GLUCOSE: 103 mg/dL — AB (ref 70–99)
POTASSIUM: 4.5 meq/L (ref 3.7–5.3)
SODIUM: 138 meq/L (ref 137–147)
Total Protein: 7 g/dL (ref 6.0–8.3)

## 2013-08-26 LAB — OCCULT BLOOD, POC DEVICE: Fecal Occult Bld: NEGATIVE

## 2013-08-26 LAB — CBC WITH DIFFERENTIAL/PLATELET
Basophils Absolute: 0 10*3/uL (ref 0.0–0.1)
Basophils Relative: 0 % (ref 0–1)
EOS ABS: 0.1 10*3/uL (ref 0.0–0.7)
Eosinophils Relative: 1 % (ref 0–5)
HCT: 39.3 % (ref 39.0–52.0)
HEMOGLOBIN: 13.4 g/dL (ref 13.0–17.0)
Lymphocytes Relative: 15 % (ref 12–46)
Lymphs Abs: 2.2 10*3/uL (ref 0.7–4.0)
MCH: 33.1 pg (ref 26.0–34.0)
MCHC: 34.1 g/dL (ref 30.0–36.0)
MCV: 97 fL (ref 78.0–100.0)
Monocytes Absolute: 0.8 10*3/uL (ref 0.1–1.0)
Monocytes Relative: 6 % (ref 3–12)
NEUTROS PCT: 78 % — AB (ref 43–77)
Neutro Abs: 10.9 10*3/uL — ABNORMAL HIGH (ref 1.7–7.7)
PLATELETS: 627 10*3/uL — AB (ref 150–400)
RBC: 4.05 MIL/uL — ABNORMAL LOW (ref 4.22–5.81)
RDW: 13.6 % (ref 11.5–15.5)
WBC: 14.1 10*3/uL — AB (ref 4.0–10.5)

## 2013-08-26 LAB — URINALYSIS, ROUTINE W REFLEX MICROSCOPIC
Bilirubin Urine: NEGATIVE
GLUCOSE, UA: NEGATIVE mg/dL
Hgb urine dipstick: NEGATIVE
KETONES UR: NEGATIVE mg/dL
Leukocytes, UA: NEGATIVE
Nitrite: NEGATIVE
PH: 8 (ref 5.0–8.0)
Protein, ur: NEGATIVE mg/dL
Specific Gravity, Urine: 1.02 (ref 1.005–1.030)
Urobilinogen, UA: 0.2 mg/dL (ref 0.0–1.0)

## 2013-08-26 LAB — URINE MICROSCOPIC-ADD ON

## 2013-08-26 MED ORDER — OXYCODONE-ACETAMINOPHEN 5-325 MG PO TABS
2.0000 | ORAL_TABLET | Freq: Once | ORAL | Status: AC
  Administered 2013-08-26: 2 via ORAL
  Filled 2013-08-26: qty 2

## 2013-08-26 MED ORDER — CEPHALEXIN 500 MG PO CAPS: 500.0000 mg | ORAL_CAPSULE | Freq: Four times a day (QID) | ORAL | Status: DC

## 2013-08-26 MED ORDER — MILK AND MOLASSES ENEMA
1.0000 | Freq: Once | RECTAL | Status: AC
  Administered 2013-08-26: 250 mL via RECTAL
  Filled 2013-08-26: qty 250

## 2013-08-26 MED ORDER — DEXTROSE 5 % IV SOLN
1.0000 g | Freq: Once | INTRAVENOUS | Status: AC
  Administered 2013-08-26: 1 g via INTRAVENOUS
  Filled 2013-08-26: qty 10

## 2013-08-26 MED ORDER — SODIUM CHLORIDE 0.9 % IV BOLUS (SEPSIS)
1000.0000 mL | Freq: Once | INTRAVENOUS | Status: AC
  Administered 2013-08-26: 1000 mL via INTRAVENOUS

## 2013-08-26 MED ORDER — LUBIPROSTONE 24 MCG PO CAPS: 24.0000 ug | ORAL_CAPSULE | Freq: Two times a day (BID) | ORAL | Status: DC

## 2013-08-26 NOTE — ED Provider Notes (Signed)
CSN: [id number]     Arrival date & time 08/26/13  1454 History   First MD Initiated Contact with Patient 08/26/13 1517     Chief Complaint  Patient presents with  . Constipation   (Consider location/radiation/quality/duration/timing/severity/associated sxs/prior Treatment) Patient is a 53 y.o. male presenting with constipation. The history is provided by medical records and a relative. No language interpreter was used.  Constipation Associated symptoms: no abdominal pain, no back pain, no diarrhea, no dysuria, no fever, no nausea and no vomiting     Evan Peterson is a 53 y.o. male  with a hx of gastric cancer status post exploratory laparotomy and J-tube placement on 08/05/2013 by Dr. Barry Dienes presents to the Emergency Department complaining of gradual, persistent, progressively worsening generalized weakness in his legs with associated chills onset approximately 10 AM this morning. Patient reports he felt fine after awaking this morning but after the home health nurse came he began to feel week. He reports on one bowel movement since his surgery.  He has been taking medications for constipation including Colace and MiraLax which she took this morning.  Patient reports last bowel movement was one week ago and was hard.  The patient's sister-in-law reports complaints of black tarry stools and rectal pain.     Record review shows that patient's gastric carcinoma with inoperable and metastatic throughout the abdomen.  He saw Dr. Barry Dienes yesterday which went well and the J-tube was working correctly.  He will followup with oncology for further discussion of chemotherapy versus return to the anomaly for treatment with Asian medicine.  Past Medical History  Diagnosis Date  . Cancer     stomach   Past Surgical History  Procedure Laterality Date  . Upper gi endoscopy    . Gastrectomy N/A 08/05/2013    Procedure: Laparoscopy Diagnostic  ;  Surgeon: Stark Klein, MD;  Location: [facility];  Service: General;   Laterality: N/A;  . Peg placement N/A 08/05/2013    Procedure:  GASTROSTOMY (PEG) PLACEMENT;  Surgeon: Stark Klein, MD;  Location: [facility];  Service: General;  Laterality: N/A;   No family history on file. History  Substance Use Topics  . Smoking status: Current Every Day Smoker -- 0.50 packs/day for 35 years    Types: Cigarettes  . Smokeless tobacco: Never Used  . Alcohol Use: No    Review of Systems  Constitutional: Positive for chills. Negative for fever, diaphoresis, appetite change, fatigue and unexpected weight change.  HENT: Negative for mouth sores.   Eyes: Negative for visual disturbance.  Respiratory: Negative for cough, chest tightness, shortness of breath and wheezing.   Cardiovascular: Negative for chest pain.  Gastrointestinal: Positive for constipation. Negative for nausea, vomiting, abdominal pain and diarrhea.  Endocrine: Negative for polydipsia, polyphagia and polyuria.  Genitourinary: Negative for dysuria, urgency, frequency and hematuria.  Musculoskeletal: Negative for back pain and neck stiffness.  Skin: Negative for rash.  Allergic/Immunologic: Negative for immunocompromised state.  Neurological: Positive for weakness. Negative for syncope, light-headedness and headaches.  Hematological: Does not bruise/bleed easily.  Psychiatric/Behavioral: Negative for sleep disturbance. The patient is not nervous/anxious.     Allergies  Asa and Clarithromycin  Home Medications   Current Outpatient Rx  Name  Route  Sig  Dispense  Refill  . docusate sodium (COLACE) 100 MG capsule   Oral   Take 100 mg by mouth 2 (two) times daily.         . Nutritional Supplements (FEEDING SUPPLEMENT, OSMOLITE 1.5 CAL,) LIQD  Increase Osmolite 1.5 at 80 mL hour via jejunostomy to 14 hours daily from 7 PM until 9 AM with 120 mL of free water before and after continuous feedings.  (5 cans daily) Patient should flush feeding tube with an additional 160 mL free water 4 times a day.    1185 mL   0     Dispense as written.   . ondansetron (ZOFRAN) 4 MG tablet   Oral   Take 1 tablet (4 mg total) by mouth every 6 (six) hours.   60 tablet   0   . oxyCODONE (ROXICODONE) 5 MG/5ML solution   Oral   Take 5-10 mLs (5-10 mg total) by mouth every 4 (four) hours as needed for moderate pain or severe pain.   500 mL   0   . polyethylene glycol (MIRALAX / GLYCOLAX) packet   Oral   Take 17 g by mouth every other day.         . cephALEXin (KEFLEX) 500 MG capsule   Oral   Take 1 capsule (500 mg total) by mouth 4 (four) times daily.   40 capsule   0   . lubiprostone (AMITIZA) 24 MCG capsule   Oral   Take 1 capsule (24 mcg total) by mouth 2 (two) times daily with a meal.   60 capsule   0    BP 106/73  Pulse 78  Temp(Src) 98.7 F (37.1 C) (Oral)  Resp 16  SpO2 96% Physical Exam  Nursing note and vitals reviewed. Constitutional: He is oriented to person, place, and time. He appears well-developed and well-nourished. No distress.  Awake, alert, nontoxic appearance  HENT:  Head: Normocephalic and atraumatic.  Mouth/Throat: Oropharynx is clear and moist. No oropharyngeal exudate.  Eyes: Conjunctivae are normal. Pupils are equal, round, and reactive to light. No scleral icterus.  Neck: Normal range of motion. Neck supple.  Cardiovascular: Regular rhythm, normal heart sounds and intact distal pulses.   No murmur heard. Mild tachycardia  Pulmonary/Chest: Effort normal and breath sounds normal. No respiratory distress. He has no wheezes. He has no rales.  Abdominal: Soft. Normal appearance and bowel sounds are normal. He exhibits no distension and no mass. There is tenderness in the right upper quadrant and epigastric area. There is guarding. There is no rigidity, no rebound and no CVA tenderness.  Patient with significant right upper quadrant epigastric tenderness on palpation with guarding; no rebound  Well-healing midline incision without erythema or  induration J-tube in place with mild crusting, minimal erythema and no induration  Genitourinary: Rectal exam shows external hemorrhoid. Rectal exam shows no internal hemorrhoid, no fissure, no mass, no tenderness and anal tone normal. Guaiac negative stool.  Large nonthrombosed external hemorrhoid Hard stool in the rectal vault; unable to manually disimpact due to pain from external hemorrhoid  Musculoskeletal: Normal range of motion. He exhibits no edema.  Lymphadenopathy:    He has no cervical adenopathy.  Neurological: He is alert and oriented to person, place, and time. He has normal reflexes. He exhibits normal muscle tone. Coordination normal.  Speech is clear and goal oriented, follows commands Major Cranial nerves without deficit, no facial droop Normal strength in upper and lower extremities bilaterally including dorsiflexion and plantar flexion, strong and equal grip strength Sensation normal to light and sharp touch Moves extremities without ataxia, coordination intact Normal finger to nose and rapid alternating movements Neg romberg, no pronator drift  Skin: Skin is warm and dry. He is not diaphoretic.  Psychiatric:  He has a normal mood and affect.    ED Course  Fecal disimpaction Date/Time: 08/26/2013 7:01 PM Performed by: Abigail Butts Authorized by: Abigail Butts Consent: Verbal consent obtained. written consent not obtained. Risks and benefits: risks, benefits and alternatives were discussed Consent given by: patient Patient understanding: patient states understanding of the procedure being performed Patient consent: the patient's understanding of the procedure matches consent given Procedure consent: procedure consent matches procedure scheduled Relevant documents: relevant documents present and verified Test results: test results available and properly labeled Imaging studies: imaging studies available Required items: required blood products, implants,  devices, and special equipment available Patient identity confirmed: verbally with patient and arm band Time out: Immediately prior to procedure a "time out" was called to verify the correct patient, procedure, equipment, support staff and site/side marked as required. Preparation: Patient was prepped and draped in the usual sterile fashion. Local anesthesia used: no Patient sedated: no Patient tolerance: Patient tolerated the procedure well with no immediate complications. Comments: Procedure too painful to be fruitful   (including critical care time) Labs Review Labs Reviewed  CBC WITH DIFFERENTIAL - Abnormal; Notable for the following:    WBC 14.1 (*)    RBC 4.05 (*)    Platelets 627 (*)    Neutrophils Relative % 78 (*)    Neutro Abs 10.9 (*)    All other components within normal limits  COMPREHENSIVE METABOLIC PANEL - Abnormal; Notable for the following:    Glucose, Bld 103 (*)    Albumin 3.3 (*)    All other components within normal limits  URINALYSIS, ROUTINE W REFLEX MICROSCOPIC - Abnormal; Notable for the following:    APPearance TURBID (*)    All other components within normal limits  URINE MICROSCOPIC-ADD ON - Abnormal; Notable for the following:    Bacteria, UA MANY (*)    All other components within normal limits  INFLUENZA PANEL BY PCR (TYPE A & B, H1N1)  OCCULT BLOOD, POC DEVICE   Imaging Review Dg Abd Acute W/chest  08/26/2013   CLINICAL DATA:  53 year old male with pain and constipation. On narcotics for cancer pain. Initial encounter.  EXAM: ACUTE ABDOMEN SERIES (ABDOMEN 2 VIEW & CHEST 1 VIEW)  COMPARISON:  PET-CT 08/03/2013 and earlier.  FINDINGS: Normal cardiac size and mediastinal contours. No pneumothorax or pneumoperitoneum. No confluent pulmonary opacity.  Left lower quadrant probable small bowel feeding tube. Gas and stool in the colon. No dilated loops identified. No acute osseous abnormality identified.  IMPRESSION: 1. Non obstructed bowel gas pattern, no  free air. Left lower quadrant probable percutaneous small bowel feeding tube. 2.  No acute cardiopulmonary abnormality.   Electronically Signed   By: Lars Pinks M.D.   On: 08/26/2013 16:48    EKG Interpretation   None        MDM   1. Constipation   2. Jejunostomy tube in place   3. Gastric cancer, Stage 4, metastatic   4. UTI (lower urinary tract infection)      Evan Peterson presents emergency department with complaint of constipation, per my evaluation he complains of generalized weakness in his legs and he is found to have significant abdominal pain on palpation.  We'll assess for influenza, bowel obstruction and check basic labs. We'll also check fecal occult.  6:59 PM Pt with small bowel movement after enema.    7:50 PM Pt still feels poorly.  UA was in and out specimen with many bacteria after pt c/o that he  could not urinate.  Will give Rocephin IV and plan for d/c home with keflex.    9:07 PM Pt given rocephin.  He ambulates without difficulty, imbalance, gait disturbance or weakness.  Pt continues to have constipation, but was unable to tolerate digital impaction due to pain.  Pt appears more energetic and reports that he feels better.  Will d/c home with PCP follow-up. He will be discharged home with Keflex for urinary tract infection pending urine culture will be given amitiza for narcotic constipation. He has no c/o back pain or neurologic deficit on exam to indicate a concern for cauda equina from spinal metastasis.     10:09 PM Pt with large defecation and urination without assistance.  Will d/c home.  Vital signs are stable at discharge.   BP 106/73  Pulse 78  Temp(Src) 98.7 F (37.1 C) (Oral)  Resp 16  SpO2 96%  Patient/guardian has voiced understanding of findings and plan and agreed to follow-up with the PCP or specialist.  The patient was discussed with and seen by Dr. Ashok Cordia who agrees with the treatment plan.   Jarrett Soho Marilla Boddy, PA-C 08/26/13  2119  Abigail Butts, PA-C 08/26/13 2209

## 2013-08-26 NOTE — Progress Notes (Signed)
HISTORY: Patient is status post exploratory laparotomy and J-tube placement for unresectable metastatic gastric cancer with carcinomatosis. He is doing fairly well. He is not continuing to lose weight. He is tolerating his J-tube feeds at goal. He has good pain control. He denies fevers and chills.     EXAM: General:  Alert and oriented.   Incision:  Healing well.  J tube in place.     PATHOLOGY: Diagnosis 1. Peritoneum, biopsy - FIBROUS TISSUE. - NO MALIGNANCY IDENTIFIED. 2. Soft tissue, biopsy, small bowel nodule - BENIGN LYMPHOID TISSUE AND FIBROADIPOSE TISSUE. - NO MALIGNANCY IDENTIFIED. 3. Lymph node, biopsy, transverse colon - METASTATIC POORLY DIFFERENTIATED ADENOCARCINOMA. 4. Soft tissue, biopsy, small bowel mesentary nodule - FOCAL INVOLVEMENT BY POORLY DIFFERENTIATED ADENOCARCINOMA. SEE COMMENT.   ASSESSMENT AND PLAN:   Gastric cancer, Stage 4, metastatic Patient has seen Dr. Benay Spice. He is going to determine whether he wants to proceed with chemotherapy or whether he would like to go back to [place] and  [place] medicine.  Dr. Benay Spice may notify me to place a Port-A-Cath.  His pain control is reasonable. He has no evidence of surgical complications. His J-tube is functioning well.      Milus Height, MD Surgical Oncology, [facility]  Bartholome Bill, MD Bartholome Bill, MD

## 2013-08-26 NOTE — ED Notes (Signed)
Pt has not had a BM for the past week. Pt is on narcotics pain meds for pain. Pt home health nurse came in today and wanted him to be seen for constipation. Has not taken anything at home for this.

## 2013-08-26 NOTE — Discharge Instructions (Signed)
1. Medications: amitiza for constipation, keflex for UTI, usual home medications 2. Treatment: rest, drink plenty of fluids,  3. Follow Up: Please followup with your primary doctor for discussion of your diagnoses and further evaluation after today's visit; return to the ED if symptoms worsen   To Bn, Ng??i L?n (Constipation, Adult) To bn l khi m?t ng??i ?i ngoi t h?n 3 l?n m?t tu?n; g?p kh kh?n trong khi ?i ngoi ho?c c phn kh, c?ng ho?c l?n h?n bnh th??ng. Khi chng ta gi ?i, to bn ph? bi?n h?n. N?u b?n tm cch ch?a to bn b?ng thu?c gip b?n ?i ngoi (thu?c nhu?n trng), v?n ?? c th? tr? nn t?i t? h?n. S? d?ng thu?c nhu?n trng lu di c th? khi?n cho cc c? ru?t gi tr? nn y?u ?i. M?t ch? ?? ?n t ch?t x?, khng u?ng ?? n??c v vi?c dng m?t s? lo?i thu?c nh?t ??nh c th? khi?n cho to bn t?i t? h?n. NGUYN NHN  M?t s? lo?i thu?c nh?t ??nh, ch?ng h?n nh? thu?c ch?ng tr?m c?m, thu?c gi?m ?au, b? sung ch?t s?t, thu?c khng axit v thu?c l?i ti?u.  M?t s? b?nh nh?t ??nh nh? ti?u ???ng, h?i ch?ng ru?t kch thch (IBS), b?nh tuy?n gip ho?c tr?m c?m.  Khng u?ng ?? n??c.  Khng ?n ?? th?c ph?m giu ch?t x?.  C?ng th?ng ho?c ?i l?i.  Thi?u ho?t ??ng th? ch?t ho?c t?p th? d?c.  Khng ?i v? sinh khi c nhu c?u ?i ngoi.  B? qua nhu c?u ?i ngoi.  S? d?ng thu?c nhu?n trng qu nhi?u. TRI?U CH?NG  ?i ngoi t h?n 3 l?n m?t tu?n.  R?n ?? ??i ti?n.  C phn c?ng, kh ho?c l?n h?n bnh th??ng.  C?m th?y ??y b?ng ho?c ch??ng b?ng.  ?au ? vng b?ng d??i.  Khng c?m th?y nh? nhm sau khi ?i ngoi. CH?N ?ON Chuyn gia ch?m [place] s?c kh?e s? xem b?nh s? c?a b?n v khm th?c th?. Ki?m tra thm c th? ???c th?c hi?n v?i ch?ng to bn n?ng. M?t s? ki?m tra c th? bao g?m:  X-quang c th?t bari ?? khm tr?c trng, ??i trng v ?i khi c? ru?t non c?a b?n.  N?i soi ??i trng sigma ?? khm ??i trng pha d??i c?a b?n.  N?i soi ??i trng ?? khm ton b? ??i trng c?a  b?n. ?I?U TR? ?i?u tr? s? ph? thu?c vo m?c ?? nghim tr?ng c?a to bn v nh?ng g n gy ra. M?t s? ph??ng php ?i?u tr? b?ng ?n king bao g?m u?ng thm nhi?u n??c h?n v ?n nhi?u th?c ph?m giu ch?t x? h?n. Ph??ng php ?i?u tr? theo l?i s?ng c th? bao g?m t?p th? d?c th??ng xuyn. N?u cc ?? ngh? v? ch? ?? ?n u?ng v l?i s?ng ny khng c tc d?ng, chuyn gia ch?m [place] s?c kh?e c?a b?n c th? khuyn b?n nn dng thu?c nhu?n trng khng c?n k toa ?? gip b?n ?i ngoi. Thu?c c?n k toa c th? ???c k ??n n?u thu?c khng c?n k toa khng c tc d?ng. H??NG D?N CH?M [place] T?I NH  T?ng ch?t x? ?n king trong ch? ?? ?n u?ng c?a b?n, ch?ng h?n nh? tri cy, rau, ng? c?c v ??u. H?n ch? cc lo?i th?c ph?m c hm l??ng ch?t bo cao v ???ng ? qua x? l trong ch? ?? ?n u?ng c?a b?n, ch?ng h?n nh? khoai ty chin, hamburger, bnh quy, k?o v  soda.  M?t ch?t b? sung ch?t x? c th? ???c thm vo ch? ?? ?n u?ng c?a b?n n?u b?n khng th? nh?n ?? ch?t x? t? cc lo?i th?c ph?m.  U?ng ?? n??c ?? gi? cho n??c ti?u trong ho?c vng nh?t.  T?p th? d?c th??ng xuyn ho?c theo ch? d?n c?a chuyn gia ch?m [place] s?c kh?e.  ?i v? sinh khi b?n c nhu c?u. Khng c? nh?n.  Ch? s? d?ng thu?c theo ch? d?n c?a chuyn gia ch?m [place] s?c kh?e. Khng dng cc lo?i thu?c khc ?? tr? to bn m khng ni chuy?n tr??c v?i chuyn gia ch?m  s?c kh?e. HY NGAY L?P T?C ?I KHM N?U:  Phn c?a b?n c mu ?? t??i.  To bn ko di h?n 4 ngy ho?c b?n b? n?ng h?n.  B?n b? ?au b?ng ho?c ?au tr?c trng.  Phn c?a b?n m?ng, gi?ng nh? bt ch.  B?n b? gi?m cn khng gi?i thch ???c. ??M B?O B?N:  Hi?u cc h??ng d?n ny.  S? theo di tnh tr?ng c?a mnh.  S? yu c?u tr? gip ngay l?p t?c n?u b?n c?m th?y khng ?? ho?c tnh tr?ng tr?m tr?ng h?n. Document Released: 11/19/2010 Document Revised: 04/06/2013 [facility] Patient Information 2014 [place], [place].

## 2013-08-26 NOTE — Assessment & Plan Note (Signed)
Patient has seen Dr. Benay Spice. He is going to determine whether he wants to proceed with chemotherapy or whether he would like to go back to [place] and  [place] medicine.  Dr. Benay Spice may notify me to place a Port-A-Cath.  His pain control is reasonable. He has no evidence of surgical complications. His J-tube is functioning well.

## 2013-08-27 LAB — INFLUENZA PANEL BY PCR (TYPE A & B)
H1N1FLUPCR: NOT DETECTED
INFLAPCR: NEGATIVE
INFLBPCR: NEGATIVE

## 2013-08-27 NOTE — ED Provider Notes (Signed)
Medical screening examination/treatment/procedure(s) were conducted as a shared visit with non-physician practitioner(s) and myself.  I personally evaluated the patient during the encounter.  EKG Interpretation   None       Pt c/o constipation. Hx same. No nv. abd soft nt. Afeb.   Mirna Mires, MD 08/27/13 1535

## 2013-09-06 ENCOUNTER — Telehealth (INDEPENDENT_AMBULATORY_CARE_PROVIDER_SITE_OTHER): Payer: Self-pay | Admitting: *Deleted

## 2013-09-06 NOTE — Telephone Encounter (Signed)
Received a call from [place] with [facility] asking about patient's nutritional feeds.  Explained that Dr. Benay Spice is the one that is managing patient's feedings and she would need to call his office regarding the prescription that he entered.  I spoke to Dr. Barry Dienes to verify this and she said that if Dr. Benay Spice has a prescription in there already then that would mean he is managing the patient's feedings.  Called Tanisha back and left a voice message to make her aware she needs to contact Dr. Benay Spice regarding his order.

## 2013-09-07 ENCOUNTER — Other Ambulatory Visit: Payer: Self-pay | Admitting: *Deleted

## 2013-09-07 ENCOUNTER — Ambulatory Visit (HOSPITAL_BASED_OUTPATIENT_CLINIC_OR_DEPARTMENT_OTHER): Payer: BC Managed Care – PPO | Admitting: Oncology

## 2013-09-07 ENCOUNTER — Ambulatory Visit: Payer: BC Managed Care – PPO | Admitting: Nutrition

## 2013-09-07 ENCOUNTER — Telehealth: Payer: Self-pay | Admitting: Oncology

## 2013-09-07 ENCOUNTER — Other Ambulatory Visit: Payer: BC Managed Care – PPO

## 2013-09-07 ENCOUNTER — Encounter: Payer: Self-pay | Admitting: *Deleted

## 2013-09-07 VITALS — BP 107/70 | HR 77 | Temp 97.3°F | Resp 19 | Ht 63.0 in | Wt 95.1 lb

## 2013-09-07 DIAGNOSIS — E43 Unspecified severe protein-calorie malnutrition: Secondary | ICD-10-CM

## 2013-09-07 DIAGNOSIS — G893 Neoplasm related pain (acute) (chronic): Secondary | ICD-10-CM

## 2013-09-07 DIAGNOSIS — C165 Malignant neoplasm of lesser curvature of stomach, unspecified: Secondary | ICD-10-CM

## 2013-09-07 DIAGNOSIS — C169 Malignant neoplasm of stomach, unspecified: Secondary | ICD-10-CM

## 2013-09-07 DIAGNOSIS — R112 Nausea with vomiting, unspecified: Secondary | ICD-10-CM

## 2013-09-07 DIAGNOSIS — C772 Secondary and unspecified malignant neoplasm of intra-abdominal lymph nodes: Secondary | ICD-10-CM

## 2013-09-07 DIAGNOSIS — Z934 Other artificial openings of gastrointestinal tract status: Secondary | ICD-10-CM

## 2013-09-07 DIAGNOSIS — C785 Secondary malignant neoplasm of large intestine and rectum: Secondary | ICD-10-CM

## 2013-09-07 MED ORDER — OXYCODONE HCL 20 MG/ML PO CONC: 6.0000 mg | ORAL | Status: DC | PRN

## 2013-09-07 MED ORDER — ONDANSETRON 4 MG PO TBDP: 4.0000 mg | ORAL_TABLET | Freq: Three times a day (TID) | ORAL | Status: DC | PRN

## 2013-09-07 MED ORDER — LIDOCAINE-PRILOCAINE 2.5-2.5 % EX CREA: 1.0000 "application " | TOPICAL_CREAM | CUTANEOUS | Status: AC | PRN

## 2013-09-07 MED ORDER — OSMOLITE 1.5 CAL PO LIQD
1185.0000 mL | ORAL | Status: DC
Start: 2013-09-07 — End: 2013-09-07

## 2013-09-07 MED ORDER — OSMOLITE 1.5 CAL PO LIQD: ORAL | Status: AC

## 2013-09-07 NOTE — Progress Notes (Signed)
Followup completed with patient and wife, and interpreter.  Patient states he has been compliant with running Osmolite 1.5 at 80 mL an hour for 14 hours daily through jejunostomy tube.  He is using 120 mL free water before and after continuous feedings.  Patient denies problems with tolerance of tube feedings.  Patient is not eating or drinking much throughout the day.  Weight documented as 95.1 pounds January 21, which is stable from 95 pounds on January 6.  Patient has not been giving additional free water during the day.  Noted issues with constipation.  Patient states he has difficulty drinking a lot of water by mouth.  Revised Estimated nutrition needs for repletion: 1700-2100 calories, 85-100 g protein, 2.2 L fluid.  Nutrition diagnosis: Unintended weight loss continues./Remained stable.  Current tube feedings of Osmolite 1.5, 5 cans daily provides 1775 calories, 75 g protein, 1145 mL free water.  Intervention: Increase Osmolite 1.5-100 mL an hour for 14 hours daily from 9 PM until 11 AM.  Will order jejunostomy feeding tube pump that will provide additional 30 cc of water q. Hour during continuous feeding.  Patient will flush tube with 180 mL of water before and after jejunostomy feedings with an additional 180 cc of water 3 times a day.    New tube feeding regimen will provide 2130 calories, 89 g protein, 2376 mL free water.  This provides 100% of patient's estimated needs for repletion and is patient's sole source of nutrition.  Patient was educated and provided with written instructions.  Questions were answered.  [facility] was notified for change in tube feeding order.  Teach back method used.  Monitoring, evaluation, goals: Patient will tolerate change in tube feeding along with free water to promote weight gain.  Next visit: Patient is to contact me if he has questions or concerns.  Will continue to follow patient throughout treatment.  As needed.

## 2013-09-07 NOTE — Progress Notes (Signed)
HER-2 NEU testing on   Accession: [id number] request made to pathology per Per  Order from Dr. Benay Spice. Spoke with J. Epps in pathology.

## 2013-09-07 NOTE — Telephone Encounter (Signed)
gv and printed appt sched and advs for pt for Feb...sed added tx.Marland KitchenMarland KitchenMarland KitchenDesk nurse from [facility] will call back with d.t for port placement...one Dr. Barry Dienes reviews

## 2013-09-07 NOTE — Progress Notes (Signed)
   [place]    OFFICE PROGRESS NOTE   INTERVAL HISTORY:   He returns for scheduled followup of gastric cancer. He is with an interpreter and his family. He has decided to continue treatment and [place].  Evan Peterson reports abdominal pain. He is tolerating the tube feedings without difficulty. He developed his and vomits when he eats. He has difficulty taking pain medication by mouth.  Objective:  Vital signs in last 24 hours:  Blood pressure 107/70, pulse 77, temperature 97.3 F (36.3 C), temperature source Oral, resp. rate 19, height $RemoveBe'5\' 3"'ubYQurTjh$  (1.6 m), weight 95 lb 1.6 oz (43.137 kg).    HEENT: No thrush or ulcers Resp: Lungs clear bilaterally Cardio: Regular rate and rhythm GI: Left upper quadrant jejunostomy tube site without evidence of infection. Diffuse mild tenderness. A mass Vascular: No leg edema   Lab Results:  Lab Results  Component Value Date   WBC 14.1* 08/26/2013   HGB 13.4 08/26/2013   HCT 39.3 08/26/2013   MCV 97.0 08/26/2013   PLT 627* 08/26/2013   NEUTROABS 10.9* 08/26/2013      Medications: I have reviewed the patient's current medications.  Assessment/Plan: 1. Metastatic gastric cancer, stage IV-status post a diagnostic laparoscopy 08/05/2013 confirming carcinomatosis with biopsies of a transverse colon lymph node and small bowel mesentery nodule confirming metastatic poorly differentiated adenocarcinoma.  Lesser curvature gastric mass confirmed at endoscopy 07/12/2013 with a biopsy revealing poorly differentiated adenocarcinoma 2. Anorexia/weight loss and malnutrition-currently maintained on jejunostomy tube feedings  3. Pain secondary to gastric cancer  4. Intermittent nausea and vomiting secondary to gastric cancer   I discussed the diagnosis and prognosis with Evan Peterson and his family with the aid of an interpreter. He understands no therapy will be curative. I recommend a trial of systemic chemotherapy versus supportive care. There was  inadequate tissue on the gastric biopsy to perform HER-2/neu testing. We will request HER-2/neu testing on the surgical specimen. He indicated he wishes to proceed with a trial of systemic therapy.  We discussed FOLFOX chemotherapy. I reviewed the specific toxicities associated with this regimen including the chance for nausea, mucositis, alopecia, diarrhea, and hematologic toxicity. We reviewed the rash, hyperpigmentation, and hand/foot syndrome associated with 5-fluorouracil. We discussed the various types of neuropathy seen with oxaliplatin. He will attend a chemotherapy teaching class today. We will add Herceptin if the tumor returns HER-2 amplified.  He will meet with the cancer Center nutritionist today to optimize his calorie intake. He will try OxyFast for pain and Zofran ODT for nausea.   We referred him to Dr. Barry Dienes for placement of a Port-A-Cath with the plan to begin chemotherapy on 09/22/2012. He will be seen for an office visit that day.  Betsy Coder, MD  09/07/2013  9:59 AM

## 2013-09-08 NOTE — Progress Notes (Signed)
Dr. Barry Dienes could you please put orders in [facility] as patient has pre-op appointment on 09/09/2013 at 1030 am. Thank you!

## 2013-09-08 NOTE — Patient Instructions (Addendum)
Evan Peterson  09/08/2013   Your procedure is scheduled on: Tuesday January 27th  Report to [facility] at 830 AM.  Call this number if you have problems the morning of surgery [phone]   Remember:NO tube feedings after midnight night before surgery.       Take these medicines the morning of surgery with A SIP OF WATER: oxycodone thru peg tube if needed,                                 [facility] [place] may not have any metal on your body including hair pins and piercings  Do not wear jewelry, make-up.  Do not wear lotions, powders, or perfumes. You may wear deodorant.   Men may shave face and neck.  Do not bring valuables to the hospital. [place].  Contacts, dentures or bridgework may not be worn into surgery.  Leave suitcase in the car. After surgery it may be brought to your room.  For patients admitted to the hospital, checkout time is 11:00 AM the day of discharge.   Patients discharged the day of surgery will not be allowed to drive home.  Name and phone number of your driver:  Special Instructions: N/A  Please read over the following fact sheets that you were given: [facility] Preparing for surgery sheet  Call Zelphia Cairo RN pre op nurse if needed [phone]    [place].  PATIENT SIGNATURE___________________________________________  NURSE SIGNATURE_____________________________________________

## 2013-09-08 NOTE — Progress Notes (Addendum)
CMET 08-26-13 EPIC cbc with dif abnormal 08-26-13, will repeat cbc with pre op 08-26-13 DG ABD ACUTE WITH CHEST EPIC Confirmation of [place] interpreter for day of surgery faxed by clinical social work and placed on pt chart

## 2013-09-09 ENCOUNTER — Encounter (HOSPITAL_COMMUNITY): Payer: Self-pay | Admitting: Pharmacy Technician

## 2013-09-09 ENCOUNTER — Encounter (HOSPITAL_COMMUNITY): Payer: Self-pay

## 2013-09-09 ENCOUNTER — Encounter (HOSPITAL_COMMUNITY)
Admission: RE | Admit: 2013-09-09 | Discharge: 2013-09-09 | Disposition: A | Discharge status: Home / Self Care | Payer: BC Managed Care – PPO | Source: Ambulatory Visit | Attending: General Surgery | Admitting: General Surgery

## 2013-09-09 DIAGNOSIS — Z01812 Encounter for preprocedural laboratory examination: Secondary | ICD-10-CM | POA: Insufficient documentation

## 2013-09-09 LAB — CBC
HCT: 39.8 % (ref 39.0–52.0)
Hemoglobin: 13.5 g/dL (ref 13.0–17.0)
MCH: 32.9 pg (ref 26.0–34.0)
MCHC: 33.9 g/dL (ref 30.0–36.0)
MCV: 97.1 fL (ref 78.0–100.0)
PLATELETS: 626 10*3/uL — AB (ref 150–400)
RBC: 4.1 MIL/uL — AB (ref 4.22–5.81)
RDW: 14 % (ref 11.5–15.5)
WBC: 14.7 10*3/uL — AB (ref 4.0–10.5)

## 2013-09-09 NOTE — Progress Notes (Signed)
Cbc results routed to dr Elvia Collum by epic

## 2013-09-09 NOTE — Progress Notes (Signed)
No orders at pre op visit did cbc per anesthesai pt has cmet 08-26-13 epic, thanks

## 2013-09-12 ENCOUNTER — Other Ambulatory Visit (INDEPENDENT_AMBULATORY_CARE_PROVIDER_SITE_OTHER): Payer: Self-pay | Admitting: General Surgery

## 2013-09-14 ENCOUNTER — Other Ambulatory Visit: Payer: Self-pay | Admitting: *Deleted

## 2013-09-14 ENCOUNTER — Ambulatory Visit (HOSPITAL_COMMUNITY): Payer: BC Managed Care – PPO

## 2013-09-14 ENCOUNTER — Encounter (HOSPITAL_COMMUNITY): Payer: BC Managed Care – PPO | Admitting: Anesthesiology

## 2013-09-14 ENCOUNTER — Ambulatory Visit (HOSPITAL_COMMUNITY)
Admission: RE | Admit: 2013-09-14 | Discharge: 2013-09-14 | Disposition: A | Discharge status: Home / Self Care | Payer: BC Managed Care – PPO | Source: Ambulatory Visit | Attending: General Surgery | Admitting: General Surgery

## 2013-09-14 ENCOUNTER — Encounter (HOSPITAL_COMMUNITY)
Admission: RE | Disposition: A | Discharge status: Home / Self Care | Payer: Self-pay | Source: Ambulatory Visit | Attending: General Surgery

## 2013-09-14 ENCOUNTER — Ambulatory Visit (HOSPITAL_COMMUNITY): Payer: BC Managed Care – PPO | Admitting: Anesthesiology

## 2013-09-14 ENCOUNTER — Encounter (HOSPITAL_COMMUNITY): Payer: Self-pay | Admitting: *Deleted

## 2013-09-14 DIAGNOSIS — Z87891 Personal history of nicotine dependence: Secondary | ICD-10-CM | POA: Insufficient documentation

## 2013-09-14 DIAGNOSIS — C169 Malignant neoplasm of stomach, unspecified: Secondary | ICD-10-CM | POA: Insufficient documentation

## 2013-09-14 HISTORY — PX: PORTACATH PLACEMENT: SHX2246

## 2013-09-14 SURGERY — INSERTION, TUNNELED CENTRAL VENOUS DEVICE, WITH PORT
Anesthesia: General | Site: Chest | Laterality: Left

## 2013-09-14 MED ORDER — 0.9 % SODIUM CHLORIDE (POUR BTL) OPTIME
TOPICAL | Status: DC | PRN
  Administered 2013-09-14: 1000 mL

## 2013-09-14 MED ORDER — LACTATED RINGERS IV SOLN
INTRAVENOUS | Status: DC
  Administered 2013-09-14: 1000 mL via INTRAVENOUS

## 2013-09-14 MED ORDER — CEFAZOLIN SODIUM-DEXTROSE 2-3 GM-% IV SOLR
2.0000 g | INTRAVENOUS | Status: AC
  Administered 2013-09-14: 2 g via INTRAVENOUS

## 2013-09-14 MED ORDER — SODIUM CHLORIDE 0.9 % IJ SOLN
3.0000 mL | INTRAMUSCULAR | Status: DC | PRN
Start: 2013-09-14 — End: 2013-09-14

## 2013-09-14 MED ORDER — ONDANSETRON HCL 4 MG/2ML IJ SOLN: 4.0000 mg | Freq: Four times a day (QID) | INTRAMUSCULAR | Status: DC | PRN

## 2013-09-14 MED ORDER — PROPOFOL 10 MG/ML IV BOLUS
INTRAVENOUS | Status: AC
  Filled 2013-09-14: qty 20

## 2013-09-14 MED ORDER — FENTANYL CITRATE 0.05 MG/ML IJ SOLN
INTRAMUSCULAR | Status: DC | PRN
  Administered 2013-09-14: 50 ug via INTRAVENOUS
  Administered 2013-09-14 (×2): 25 ug via INTRAVENOUS

## 2013-09-14 MED ORDER — SODIUM CHLORIDE 0.9 % IV SOLN: 250.0000 mL | INTRAVENOUS | Status: DC | PRN

## 2013-09-14 MED ORDER — LIDOCAINE HCL 1 % IJ SOLN
INTRAMUSCULAR | Status: DC | PRN
  Administered 2013-09-14: 5 mL

## 2013-09-14 MED ORDER — CEFAZOLIN SODIUM-DEXTROSE 2-3 GM-% IV SOLR
INTRAVENOUS | Status: AC
  Filled 2013-09-14: qty 50

## 2013-09-14 MED ORDER — PROPOFOL 10 MG/ML IV BOLUS
INTRAVENOUS | Status: DC | PRN
  Administered 2013-09-14: 120 mg via INTRAVENOUS

## 2013-09-14 MED ORDER — PROMETHAZINE HCL 25 MG/ML IJ SOLN: 6.2500 mg | INTRAMUSCULAR | Status: DC | PRN

## 2013-09-14 MED ORDER — SODIUM CHLORIDE 0.9 % IJ SOLN: 3.0000 mL | Freq: Two times a day (BID) | INTRAMUSCULAR | Status: DC

## 2013-09-14 MED ORDER — ONDANSETRON HCL 4 MG/5ML PO SOLN: 4.0000 mg | Freq: Three times a day (TID) | ORAL | Status: DC | PRN

## 2013-09-14 MED ORDER — ONDANSETRON HCL 4 MG/2ML IJ SOLN
INTRAMUSCULAR | Status: DC | PRN
  Administered 2013-09-14: 4 mg via INTRAVENOUS

## 2013-09-14 MED ORDER — MIDAZOLAM HCL 2 MG/2ML IJ SOLN
INTRAMUSCULAR | Status: AC
  Filled 2013-09-14: qty 2

## 2013-09-14 MED ORDER — OXYCODONE HCL 5 MG PO TABS: 5.0000 mg | ORAL_TABLET | ORAL | Status: DC | PRN

## 2013-09-14 MED ORDER — FENTANYL CITRATE 0.05 MG/ML IJ SOLN
INTRAMUSCULAR | Status: AC
  Filled 2013-09-14: qty 2

## 2013-09-14 MED ORDER — FENTANYL CITRATE 0.05 MG/ML IJ SOLN: 25.0000 ug | INTRAMUSCULAR | Status: DC | PRN

## 2013-09-14 MED ORDER — BUPIVACAINE-EPINEPHRINE PF 0.25-1:200000 % IJ SOLN
INTRAMUSCULAR | Status: AC
  Filled 2013-09-14: qty 30

## 2013-09-14 MED ORDER — BUPIVACAINE HCL (PF) 0.25 % IJ SOLN
INTRAMUSCULAR | Status: DC | PRN
  Administered 2013-09-14: 5 mL

## 2013-09-14 MED ORDER — ACETAMINOPHEN 650 MG RE SUPP
650.0000 mg | RECTAL | Status: DC | PRN
Start: 2013-09-14 — End: 2013-09-14
  Filled 2013-09-14: qty 1

## 2013-09-14 MED ORDER — LACTATED RINGERS IV SOLN: INTRAVENOUS | Status: DC

## 2013-09-14 MED ORDER — SODIUM CHLORIDE 0.9 % IR SOLN
Freq: Once | Status: AC
  Administered 2013-09-14: 12:00:00
  Filled 2013-09-14: qty 1.2

## 2013-09-14 MED ORDER — ONDANSETRON HCL 4 MG/2ML IJ SOLN
INTRAMUSCULAR | Status: AC
  Filled 2013-09-14: qty 2

## 2013-09-14 MED ORDER — HEPARIN SOD (PORK) LOCK FLUSH 100 UNIT/ML IV SOLN
INTRAVENOUS | Status: AC
  Filled 2013-09-14: qty 5

## 2013-09-14 MED ORDER — ACETAMINOPHEN 325 MG PO TABS: 650.0000 mg | ORAL_TABLET | ORAL | Status: DC | PRN

## 2013-09-14 MED ORDER — HEPARIN SOD (PORK) LOCK FLUSH 100 UNIT/ML IV SOLN
INTRAVENOUS | Status: DC | PRN
  Administered 2013-09-14: 500 [IU]

## 2013-09-14 MED ORDER — LIDOCAINE HCL 1 % IJ SOLN
INTRAMUSCULAR | Status: AC
  Filled 2013-09-14: qty 20

## 2013-09-14 SURGICAL SUPPLY — 37 items
BLADE HEX COATED 2.75 (ELECTRODE) ×3 IMPLANT
BLADE SURG 15 STRL LF DISP TIS (BLADE) ×1 IMPLANT
BLADE SURG 15 STRL SS (BLADE) ×2
BLADE SURG SZ11 CARB STEEL (BLADE) ×3 IMPLANT
CANISTER SUCTION 2500CC (MISCELLANEOUS) ×3 IMPLANT
CHLORAPREP W/TINT 26ML (MISCELLANEOUS) ×3 IMPLANT
DECANTER SPIKE VIAL GLASS SM (MISCELLANEOUS) ×3 IMPLANT
DERMABOND ADVANCED (GAUZE/BANDAGES/DRESSINGS) ×2
DERMABOND ADVANCED .7 DNX12 (GAUZE/BANDAGES/DRESSINGS) ×1 IMPLANT
DRAPE C-ARM 42X120 X-RAY (DRAPES) ×3 IMPLANT
DRAPE PED LAPAROTOMY (DRAPES) ×3 IMPLANT
ELECT REM PT RETURN 9FT ADLT (ELECTROSURGICAL) ×3
ELECTRODE REM PT RTRN 9FT ADLT (ELECTROSURGICAL) ×1 IMPLANT
GAUZE SPONGE 4X4 16PLY XRAY LF (GAUZE/BANDAGES/DRESSINGS) ×3 IMPLANT
GLOVE BIO SURGEON STRL SZ 6 (GLOVE) ×6 IMPLANT
GLOVE BIOGEL PI IND STRL 6.5 (GLOVE) ×1 IMPLANT
GLOVE BIOGEL PI IND STRL 7.0 (GLOVE) ×1 IMPLANT
GLOVE BIOGEL PI INDICATOR 6.5 (GLOVE) ×2
GLOVE BIOGEL PI INDICATOR 7.0 (GLOVE) ×2
GLOVE INDICATOR 6.5 STRL GRN (GLOVE) ×6 IMPLANT
GOWN SRG XL XLNG 56XLVL 4 (GOWN DISPOSABLE) ×1 IMPLANT
GOWN STRL NON-REIN XL XLG LVL4 (GOWN DISPOSABLE) ×2
GOWN STRL REUS W/TWL 2XL LVL3 (GOWN DISPOSABLE) ×3 IMPLANT
KIT BASIN OR (CUSTOM PROCEDURE TRAY) ×3 IMPLANT
KIT PORT POWER 8FR ISP CVUE (Catheter) ×3 IMPLANT
NEEDLE HYPO 22GX1.5 SAFETY (NEEDLE) ×3 IMPLANT
NEEDLE HYPO 25X1 1.5 SAFETY (NEEDLE) IMPLANT
PACK UNIVERSAL I (CUSTOM PROCEDURE TRAY) ×3 IMPLANT
PENCIL BUTTON HOLSTER BLD 10FT (ELECTRODE) ×3 IMPLANT
SUT MNCRL AB 4-0 PS2 18 (SUTURE) ×3 IMPLANT
SUT PROLENE 2 0 SH DA (SUTURE) ×6 IMPLANT
SUT VIC AB 3-0 SH 27 (SUTURE) ×2
SUT VIC AB 3-0 SH 27XBRD (SUTURE) ×1 IMPLANT
SYR BULB IRRIGATION 50ML (SYRINGE) IMPLANT
SYR CONTROL 10ML LL (SYRINGE) ×6 IMPLANT
TOWEL OR 17X26 10 PK STRL BLUE (TOWEL DISPOSABLE) ×3 IMPLANT
YANKAUER SUCT BULB TIP 10FT TU (MISCELLANEOUS) ×3 IMPLANT

## 2013-09-14 NOTE — Anesthesia Postprocedure Evaluation (Signed)
Anesthesia Post Note  Patient: Evan Peterson  Procedure(s) Performed: Procedure(s) (LRB): INSERTION PORT-A-CATH (Left)  Anesthesia type: General  Patient location: PACU  Post pain: Pain level controlled  Post assessment: Post-op Vital signs reviewed  Last Vitals:  Filed Vitals:   09/14/13 1310  BP: 111/67  Pulse: 81  Temp: 37 C  Resp: 14    Post vital signs: Reviewed  Level of consciousness: sedated  Complications: No apparent anesthesia complications

## 2013-09-14 NOTE — Preoperative (Signed)
Beta Blockers   Reason not to administer Beta Blockers:Not Applicable 

## 2013-09-14 NOTE — Op Note (Signed)
PREOPERATIVE DIAGNOSIS:  Gastric cancer     POSTOPERATIVE DIAGNOSIS:  Same     PROCEDURE: Left subclavian port placement, Bard ClearVue  Power Port, MRI safe, 8-French.      SURGEON:  Stark Klein, MD      ANESTHESIA:  General   FINDINGS:  Good venous return, easy flush, and tip of the catheter and   SVC 23.5 cm.      SPECIMEN:  None.      ESTIMATED BLOOD LOSS:  Minimal.      COMPLICATIONS:  None known.      PROCEDURE:  Pt was identified in the holding area and taken to   the operating room, where patient was placed supine on the operating room   table.  General anesthesia was induced.  Patient's arms were tucked and the upper   chest and neck were prepped and draped in sterile fashion.  Time-out was   performed according to the surgical safety check list.  When all was   correct, we continued.   Local anesthetic was administered over this   area at the angle of the clavicle.  The vein was accessed with 1 pass of the needle. There was good venous return and the wire passed easily with no ectopy.   Fluoroscopy was used to confirm that the wire was in the vena cava.      The patient was placed back level and the area for the pocket was anethetized   with local anesthetic.  A 3-cm transverse incision was made with a #15   blade.  Cautery was used to divide the subcutaneous tissues down to the   pectoralis muscle.  An [employer] retractor was used to elevate the skin   while a pocket was created on top of the pectoralis fascia.  The port   was placed into the pocket to confirm that it was of adequate size.  The   catheter was preattached to the port.  The port was then secured to the   pectoralis fascia with four 2-0 Prolene sutures.  These were clamped and   not tied down yet.    The catheter was tunneled through to the wire exit   site.  The catheter was placed along the wire to determine what length it should be to be in the SVC.  The catheter was cut at 23.5 cm.  The tunneler  sheath and dilator were passed over the wire and the dilator and wire were removed.  The catheter was advanced through the tunneler sheath and the tunneler sheath was pulled away.  Care was taken to keep the catheter in the tunneler sheath as this occurred. This was advanced and the tunneler sheath was removed.  There was good venous   return and easy flush of the catheter.  The Prolene sutures were tied   down to the pectoral fascia.  The skin was reapproximated using 3-0   Vicryl interrupted deep dermal sutures.    Fluoroscopy was used to re-confirm good position of the catheter.  The skin   was then closed using 4-0 Monocryl in a subcuticular fashion.  The port was flushed with concentrated heparin flush as well.  The wounds were then cleaned, dried, and dressed with Dermabond.  The patient was awakened from anesthesia and taken to the PACU in stable condition.  Needle, sponge, and instrument counts were correct.               Stark Klein, MD

## 2013-09-14 NOTE — Telephone Encounter (Signed)
Pt's wife came to office today requesting ODT  Zofran Rx to be changed to liquid form to be used in tube. OK, per Dr. Benay Spice: Rx sent to [facility].

## 2013-09-14 NOTE — Interval H&P Note (Signed)
History and Physical Interval Note:  09/14/2013 10:42 AM  Evan Peterson  has presented today for surgery, with the diagnosis of gastric cancer  The various methods of treatment have been discussed with the patient and family. After consideration of risks, benefits and other options for treatment, the patient has consented to  Procedure(s): INSERTION PORT-A-CATH (N/A) as a surgical intervention .  The patient's history has been reviewed, patient examined, no change in status, stable for surgery.  I have reviewed the patient's chart and labs.  Questions were answered to the patient's satisfaction.     Angeles Paolucci

## 2013-09-14 NOTE — H&P (View-Only) (Signed)
HISTORY: Patient is status post exploratory laparotomy and J-tube placement for unresectable metastatic gastric cancer with carcinomatosis. He is doing fairly well. He is not continuing to lose weight. He is tolerating his J-tube feeds at goal. He has good pain control. He denies fevers and chills.     EXAM: General:  Alert and oriented.   Incision:  Healing well.  J tube in place.     PATHOLOGY: Diagnosis 1. Peritoneum, biopsy - FIBROUS TISSUE. - NO MALIGNANCY IDENTIFIED. 2. Soft tissue, biopsy, small bowel nodule - BENIGN LYMPHOID TISSUE AND FIBROADIPOSE TISSUE. - NO MALIGNANCY IDENTIFIED. 3. Lymph node, biopsy, transverse colon - METASTATIC POORLY DIFFERENTIATED ADENOCARCINOMA. 4. Soft tissue, biopsy, small bowel mesentary nodule - FOCAL INVOLVEMENT BY POORLY DIFFERENTIATED ADENOCARCINOMA. SEE COMMENT.   ASSESSMENT AND PLAN:   Gastric cancer, Stage 4, metastatic Patient has seen Dr. Sherrill. He is going to determine whether he wants to proceed with chemotherapy or whether he would like to go back to [place] and  Asian medicine.  Dr. Sherrill may notify me to place a Port-A-Cath.  His pain control is reasonable. He has no evidence of surgical complications. His J-tube is functioning well.      Marrie Chandra L Amelia Macken, MD Surgical Oncology, General & Endocrine Surgery [facility]  Boyd, Tammy Lamonica, MD Boyd, Tammy Lamonica, MD    

## 2013-09-14 NOTE — Anesthesia Preprocedure Evaluation (Addendum)
Anesthesia Evaluation  Patient identified by MRN, date of birth, ID band Patient awake    Reviewed: Allergy & Precautions, H&P , NPO status , Patient's Chart, lab work & pertinent test results  Airway Mallampati: II TM Distance: >3 FB Neck ROM: Full    Dental  (+) Dental Advisory Given, Partial Upper and Partial Lower   Pulmonary Current Smoker, former smoker,  breath sounds clear to auscultation        Cardiovascular negative cardio ROS  Rhythm:Regular Rate:Normal     Neuro/Psych negative neurological ROS  negative psych ROS   GI/Hepatic Neg liver ROS, Gastric CA   Endo/Other  negative endocrine ROS  Renal/GU negative Renal ROS     Musculoskeletal negative musculoskeletal ROS (+)   Abdominal   Peds  Hematology negative hematology ROS (+)   Anesthesia Other Findings   Reproductive/Obstetrics negative OB ROS                          Anesthesia Physical Anesthesia Plan  ASA: III  Anesthesia Plan: General   Post-op Pain Management:    Induction: Intravenous  Airway Management Planned: LMA  Additional Equipment:   Intra-op Plan:   Post-operative Plan: Extubation in OR  Informed Consent: I have reviewed the patients History and Physical, chart, labs and discussed the procedure including the risks, benefits and alternatives for the proposed anesthesia with the patient or authorized representative who has indicated his/her understanding and acceptance.   Dental advisory given  Plan Discussed with: CRNA  Anesthesia Plan Comments:         Anesthesia Quick Evaluation

## 2013-09-14 NOTE — Discharge Instructions (Signed)
[facility] Office Phone Number [phone]   POST OP INSTRUCTIONS  Always review your discharge instruction sheet given to you by the facility where your surgery was performed.  IF YOU HAVE DISABILITY OR FAMILY LEAVE FORMS, YOU MUST BRING THEM TO THE OFFICE FOR PROCESSING.  DO NOT GIVE THEM TO YOUR DOCTOR.  1. A prescription for pain medication may be given to you upon discharge.  Take your pain medication as prescribed, if needed.  If narcotic pain medicine is not needed, then you may take acetaminophen (Tylenol) or ibuprofen (Advil) as needed. 2. Take your usually prescribed medications unless otherwise directed 3. If you need a refill on your pain medication, please contact your pharmacy.  They will contact our office to request authorization.  Prescriptions will not be filled after 5pm or on week-ends. 4. You should eat very light the first 24 hours after surgery, such as soup, crackers, pudding, etc.  Resume your normal diet the day after surgery 5. It is common to experience some constipation if taking pain medication after surgery.  Increasing fluid intake and taking a stool softener will usually help or prevent this problem from occurring.  A mild laxative (Milk of Magnesia or Miralax) should be taken according to package directions if there are no bowel movements after 48 hours. 6. You may shower in 48 hours.  The surgical glue will flake off in 2-3 weeks.   7. ACTIVITIES:  No strenuous activity or heavy lifting for 1 week.   a. You may drive when you no longer are taking prescription pain medication, you can comfortably wear a seatbelt, and you can safely maneuver your car and apply brakes. b. RETURN TO WORK:  __________n/a_______________ You should see your doctor in the office for a follow-up appointment approximately three-four weeks after your surgery.    WHEN TO CALL YOUR DOCTOR: 1. Fever over 101.0 2. Nausea and/or vomiting. 3. Extreme swelling or  bruising. 4. Continued bleeding from incision. 5. Increased pain, redness, or drainage from the incision.  The clinic staff is available to answer your questions during regular business hours.  Please don't hesitate to call and ask to speak to one of the nurses for clinical concerns.  If you have a medical emergency, go to the nearest emergency room or call 911.  A surgeon from [facility] is always on call at the hospital.  For further questions, please visit [email or web address]      

## 2013-09-14 NOTE — Transfer of Care (Signed)
Immediate Anesthesia Transfer of Care Note  Patient: Evan Peterson  Procedure(s) Performed: Procedure(s): INSERTION PORT-A-CATH (Left)  Patient Location: PACU  Anesthesia Type:General  Level of Consciousness: awake and patient cooperative  Airway & Oxygen Therapy: Patient Spontanous Breathing and Patient connected to face mask oxygen  Post-op Assessment: Report given to PACU RN and Post -op Vital signs reviewed and stable  Post vital signs: Reviewed and stable  Complications: No apparent anesthesia complications

## 2013-09-15 ENCOUNTER — Encounter (HOSPITAL_COMMUNITY): Payer: Self-pay | Admitting: General Surgery

## 2013-09-18 ENCOUNTER — Other Ambulatory Visit: Payer: Self-pay | Admitting: Oncology

## 2013-09-21 ENCOUNTER — Ambulatory Visit: Payer: BC Managed Care – PPO | Admitting: Nutrition

## 2013-09-21 ENCOUNTER — Ambulatory Visit (HOSPITAL_BASED_OUTPATIENT_CLINIC_OR_DEPARTMENT_OTHER): Payer: BC Managed Care – PPO | Admitting: Nurse Practitioner

## 2013-09-21 ENCOUNTER — Telehealth: Payer: Self-pay | Admitting: Oncology

## 2013-09-21 ENCOUNTER — Ambulatory Visit (HOSPITAL_BASED_OUTPATIENT_CLINIC_OR_DEPARTMENT_OTHER): Payer: BC Managed Care – PPO

## 2013-09-21 ENCOUNTER — Other Ambulatory Visit: Payer: BC Managed Care – PPO

## 2013-09-21 ENCOUNTER — Telehealth: Payer: Self-pay | Admitting: *Deleted

## 2013-09-21 VITALS — BP 111/79 | HR 71 | Temp 97.9°F | Resp 18 | Ht 63.0 in | Wt 98.7 lb

## 2013-09-21 DIAGNOSIS — C169 Malignant neoplasm of stomach, unspecified: Secondary | ICD-10-CM

## 2013-09-21 DIAGNOSIS — G893 Neoplasm related pain (acute) (chronic): Secondary | ICD-10-CM

## 2013-09-21 DIAGNOSIS — R112 Nausea with vomiting, unspecified: Secondary | ICD-10-CM

## 2013-09-21 DIAGNOSIS — Z5111 Encounter for antineoplastic chemotherapy: Secondary | ICD-10-CM

## 2013-09-21 DIAGNOSIS — C165 Malignant neoplasm of lesser curvature of stomach, unspecified: Secondary | ICD-10-CM

## 2013-09-21 DIAGNOSIS — C772 Secondary and unspecified malignant neoplasm of intra-abdominal lymph nodes: Secondary | ICD-10-CM

## 2013-09-21 MED ORDER — DEXTROSE 5 % IV SOLN
550.0000 mg | Freq: Once | INTRAVENOUS | Status: AC
  Administered 2013-09-21: 550 mg via INTRAVENOUS
  Filled 2013-09-21: qty 27.5

## 2013-09-21 MED ORDER — DEXAMETHASONE SODIUM PHOSPHATE 10 MG/ML IJ SOLN
10.0000 mg | Freq: Once | INTRAMUSCULAR | Status: AC
  Administered 2013-09-21: 10 mg via INTRAVENOUS

## 2013-09-21 MED ORDER — OXYCODONE HCL 5 MG/5ML PO SOLN: 10.0000 mg | Freq: Four times a day (QID) | ORAL | Status: DC | PRN

## 2013-09-21 MED ORDER — DEXTROSE 5 % IV SOLN
Freq: Once | INTRAVENOUS | Status: AC
  Administered 2013-09-21: 12:00:00 via INTRAVENOUS

## 2013-09-21 MED ORDER — FLUOROURACIL CHEMO INJECTION 2.5 GM/50ML
400.0000 mg/m2 | Freq: Once | INTRAVENOUS | Status: AC
  Administered 2013-09-21: 550 mg via INTRAVENOUS
  Filled 2013-09-21: qty 11

## 2013-09-21 MED ORDER — ONDANSETRON 8 MG/NS 50 ML IVPB
INTRAVENOUS | Status: AC
  Filled 2013-09-21: qty 8

## 2013-09-21 MED ORDER — OXALIPLATIN CHEMO INJECTION 100 MG/20ML
85.0000 mg/m2 | Freq: Once | INTRAVENOUS | Status: AC
  Administered 2013-09-21: 115 mg via INTRAVENOUS
  Filled 2013-09-21: qty 23

## 2013-09-21 MED ORDER — DEXAMETHASONE SODIUM PHOSPHATE 10 MG/ML IJ SOLN
INTRAMUSCULAR | Status: AC
  Filled 2013-09-21: qty 1

## 2013-09-21 MED ORDER — SODIUM CHLORIDE 0.9 % IV SOLN
2400.0000 mg/m2 | INTRAVENOUS | Status: AC
  Administered 2013-09-21: 3300 mg via INTRAVENOUS
  Filled 2013-09-21: qty 66

## 2013-09-21 MED ORDER — ONDANSETRON 8 MG/50ML IVPB (CHCC)
8.0000 mg | Freq: Once | INTRAVENOUS | Status: AC
  Administered 2013-09-21: 8 mg via INTRAVENOUS

## 2013-09-21 NOTE — Telephone Encounter (Signed)
Gave pt appt for lab,md anc chemo for February and MArch 2015

## 2013-09-21 NOTE — Patient Instructions (Signed)
[place] Discharge Instructions for Patients Receiving Chemotherapy  Today you received the following chemotherapy agents: Oxaliplatin, Leucovorin, 5FU. You are going home with a pump containing chemotherapy for 46 hours. You will return on Friday to get the pump disconnected.  To help prevent nausea and vomiting after your treatment, we encourage you to take your nausea medication as prescribed.   If you develop nausea and vomiting that is not controlled by your nausea medication, call the clinic.   BELOW ARE SYMPTOMS THAT SHOULD BE REPORTED IMMEDIATELY:  *FEVER GREATER THAN 100.5 F  *CHILLS WITH OR WITHOUT FEVER  NAUSEA AND VOMITING THAT IS NOT CONTROLLED WITH YOUR NAUSEA MEDICATION  *UNUSUAL SHORTNESS OF BREATH  *UNUSUAL BRUISING OR BLEEDING  TENDERNESS IN MOUTH AND THROAT WITH OR WITHOUT PRESENCE OF ULCERS  *URINARY PROBLEMS  *BOWEL PROBLEMS  UNUSUAL RASH Items with * indicate a potential emergency and should be followed up as soon as possible.  Feel free to call the clinic you have any questions or concerns. The clinic phone number is [phone].

## 2013-09-21 NOTE — Progress Notes (Signed)
Patient was sleeping soundly.  I talked with patient's wife who reports patient is doing well with current tube feeding regimen.  He is compliant and has been using Osmolite 1.5 at 100 mL an hour for 14 hours daily.  He is flushing his feeding tube with 180 mL of water before and after jejunostomy feedings with an additional 180 cc of water 3 times a day.  He has received a feeding tube pump that provides additional 30 cc of water every hour during his continuous feeding.  Tube feedings are providing 2130 calories, 89 g protein, 2376 mL free water.  This is 100% of estimated needs for repletion and continues to be patient's sole source of nutrition.  Weight has increased to 98.7 pounds February 4, from 95 pounds January 23.  Nutrition diagnosis: Unintended weight loss improved.  Intervention: No changes needed to tube feeding at this time.  He will continue present tube feeding regimen to promote weight gain.  Monitoring, evaluation, goals: Patient will continue to tolerate tube feedings for repletion to achieve weight gain.  Next visit: Wednesday, February 18, during chemotherapy.

## 2013-09-21 NOTE — Progress Notes (Addendum)
OFFICE PROGRESS NOTE  Interval history:  Evan Peterson returns for followup of metastatic gastric cancer. He continues to have abdominal pain. He is taking oxycodone 5 mg per 5 cc every 4-6 hours as needed. He estimates taking pain medication 3-4 times a day. The pain medication adequately relieves his pain. He reports he is tolerating the tube feedings well. He reports he has a goal rate. He has occasional vomiting. Minimal oral intake. He intermittently sips of water.  He started an herbal medication about 2 weeks ago and wonders if his cancer is better.   Objective: Filed Vitals:   09/21/13 1035  BP: 111/79  Pulse: 71  Temp: 97.9 F (36.6 C)  Resp: 18   No thrush or ulcerations. Lungs clear. Regular cardiac rhythm. Port-A-Cath site covered with a gauze bandage. Marked tenderness right abdomen. Left upper quadrant jejunostomy tube site is without erythema. No leg edema.   Lab Results: Lab Results  Component Value Date   WBC 14.7* 09/09/2013   HGB 13.5 09/09/2013   HCT 39.8 09/09/2013   MCV 97.1 09/09/2013   PLT 626* 09/09/2013   NEUTROABS 10.9* 08/26/2013    Chemistry:    Chemistry      Component Value Date/Time   NA 138 08/26/2013 1710   K 4.5 08/26/2013 1710   CL 97 08/26/2013 1710   CO2 29 08/26/2013 1710   BUN 16 08/26/2013 1710   CREATININE 0.68 08/26/2013 1710      Component Value Date/Time   CALCIUM 8.9 08/26/2013 1710   ALKPHOS 66 08/26/2013 1710   AST 27 08/26/2013 1710   ALT 40 08/26/2013 1710   BILITOT 0.3 08/26/2013 1710       Studies/Results: Dg Chest Port 1 View  09/14/2013   CLINICAL DATA:  Status post Port-A-Cath insertion.  EXAM: PORTABLE CHEST - 1 VIEW  COMPARISON:  Chest x-ray 08/26/2013.  FINDINGS: New left-sided subclavian single-lumen porta cath with tip terminating at the superior cavoatrial junction. No pneumothorax. Lung volumes are normal. No consolidative airspace disease. No pleural effusions. No pulmonary nodule or mass noted. Pulmonary vasculature and the  cardiomediastinal silhouette are within normal limits.  IMPRESSION: 1. New left subclavian single-lumen porta cath appears properly located. No acute complicating features.   Electronically Signed   By: Vinnie Langton M.D.   On: 09/14/2013 12:57   Dg Abd Acute W/chest  08/26/2013   CLINICAL DATA:  53 year old male with pain and constipation. On narcotics for cancer pain. Initial encounter.  EXAM: ACUTE ABDOMEN SERIES (ABDOMEN 2 VIEW & CHEST 1 VIEW)  COMPARISON:  PET-CT 08/03/2013 and earlier.  FINDINGS: Normal cardiac size and mediastinal contours. No pneumothorax or pneumoperitoneum. No confluent pulmonary opacity.  Left lower quadrant probable small bowel feeding tube. Gas and stool in the colon. No dilated loops identified. No acute osseous abnormality identified.  IMPRESSION: 1. Non obstructed bowel gas pattern, no free air. Left lower quadrant probable percutaneous small bowel feeding tube. 2.  No acute cardiopulmonary abnormality.   Electronically Signed   By: Lars Pinks M.D.   On: 08/26/2013 16:48   Dg C-arm 1-60 Min-no Report  09/14/2013   CLINICAL DATA: port a cath   C-ARM 1-60 MINUTES  Fluoroscopy was utilized by the requesting physician.  No radiographic  interpretation.     Medications: I have reviewed the patient's current medications.  Assessment/Plan: 1. Metastatic gastric cancer, stage IV. Status post diagnostic laparoscopy 08/05/2013 confirming carcinomatosis with biopsies of a transverse colon lymph node and small bowel mesentery nodule  confirming metastatic poorly differentiated adenocarcinoma.   Lesser curvature gastric mass confirmed on endoscopy 07/12/2013 with a biopsy revealing poorly differentiated adenocarcinoma.  2. Anorexia/weight loss and malnutrition. Maintained on jejunostomy tube feedings. 3. Pain secondary to gastric cancer. Currently taking liquid oxycodone (52m/5ml) 10 ml every 6 hours as needed. A new prescription was issued today. 4. Intermittent nausea and  vomiting secondary to gastric cancer. 5. Port-A-Cath placement by Dr. BBarry Dienes01/28/2015.  Dispositon-he is scheduled to begin FOLFOX chemotherapy today. He has attended the chemotherapy education class. We again reviewed potential toxicities associated with this regimen. He is agreeable to proceed.  He will return for a followup visit and cycle 2 FOLFOX in 2 weeks. He will contact the office in the interim with any problems.  An interpreter was present throughout today's visit.   Patient seen with Dr. SBenay Spice 25 minutes were spent face-to-face at today's visit with the majority of the time involved in counseling/coordination of care.   TNed CardANP/GNP-BC   This was a shared visit with LNed Card  We discussed a trial of FOLFOX chemotherapy versus supportive care. He agrees to FOLFOX. The tumor returned HER-2 positive. Herceptin will be added with cycle 2.  BJulieanne Manson M.D.

## 2013-09-21 NOTE — Telephone Encounter (Signed)
Per staff message and POF I have scheduled appts.  JMW  

## 2013-09-21 NOTE — Progress Notes (Unsigned)
Chemo follow up call placed for tomorrow. Phone attached is patient's son, Evan Peterson, as patient does not speak english. Patient and wife given chemo spill kit to use in case pump was to come disconnected or leak. Instructions for this given via interpreter. Patient's wife states understanding of this and will keep spill kit at home for the entire time patient is receiving chemo. Cindi Carbon, RN

## 2013-09-22 ENCOUNTER — Telehealth: Payer: Self-pay | Admitting: *Deleted

## 2013-09-22 LAB — CEA: CEA: 1.5 ng/mL (ref 0.0–5.0)

## 2013-09-22 NOTE — Telephone Encounter (Signed)
Message copied by Cherylynn Ridges on Thu Sep 22, 2013 11:49 AM ------      Message from: Christa See      Created: Wed Sep 21, 2013 12:34 PM      Regarding: First time chemo f/u call      Contact: [phone]       Patient had 1st time FOLFOX on Wednesday. MD is Sherrill. Number above is son, Laverna Peace. Patient does not speak english. Per patient okay to call Laverna Peace for f/u call. Thanks! ------

## 2013-09-22 NOTE — Telephone Encounter (Signed)
PT. IS VOMITING A SMALL AMOUNT OF LIQUID. HE HAS TAKEN THE ONDANSETRON 4MG /5ML VIA FEEDING TUBE TWICE SINCE CHEMO. SPOKE TO DR.SHERRILL'S NURSE, SUSAN COWARD,RN. INSTRUCTED PT. TO TAKE THE ONDANSETRON 4MG /5ML VIA FEEDING TUBE EVERY SIX HOURS AROUND THE CLOCK UNTIL HE RETURNS TO THE OFFICE TOMORROW. PT. WILL TAKE ONLY SIPS OF WATER BY MOUTH. DR.SHERRILL'S NURSE, SUSAN COWARD,RN WILL SEE PT. IN THE INFUSION ROOM TOMORROW. INSTRUCTED PT. IF HIS CONDITION GETS WORSE TO GO TO THE EMERGENCY ROOM. PT. VOICES UNDERSTANDING.

## 2013-09-22 NOTE — Telephone Encounter (Signed)
Called Evan Peterson for chemotherapy F/U.  Spoke with son Horris Latino who speaks [place].  Patient is doing fairly well.  Reports unable to eat due to vomiting with everything taken in by mouth.  Has a feeding tube and with this he is sitting upright and "most of what's given stays down with no vomiting.  Giving water in the tube as well.  Asked if we can get cans of osmolyte for him as I am paying for this with my debit card.  Instructed to stop by the financial advocates office tomorrow.  Scheduled at 2:00 pm for pump disconnect.  Denies any new side effects or symptoms.  Bowel and bladder is functioning well.  Instructed to drink 64 oz minimum daily or at least the day before, of and after treatment.  No further questions at this time and encouraged to call if needed.  Reviewed how to call after hours in the case of an emergency.

## 2013-09-23 ENCOUNTER — Telehealth: Payer: Self-pay | Admitting: *Deleted

## 2013-09-23 ENCOUNTER — Ambulatory Visit (HOSPITAL_BASED_OUTPATIENT_CLINIC_OR_DEPARTMENT_OTHER): Payer: BC Managed Care – PPO

## 2013-09-23 VITALS — BP 118/76 | HR 94 | Temp 97.6°F

## 2013-09-23 DIAGNOSIS — C165 Malignant neoplasm of lesser curvature of stomach, unspecified: Secondary | ICD-10-CM

## 2013-09-23 DIAGNOSIS — C772 Secondary and unspecified malignant neoplasm of intra-abdominal lymph nodes: Secondary | ICD-10-CM

## 2013-09-23 DIAGNOSIS — C169 Malignant neoplasm of stomach, unspecified: Secondary | ICD-10-CM

## 2013-09-23 MED ORDER — HEPARIN SOD (PORK) LOCK FLUSH 100 UNIT/ML IV SOLN
500.0000 [IU] | Freq: Once | INTRAVENOUS | Status: AC | PRN
  Administered 2013-09-23: 500 [IU]
  Filled 2013-09-23: qty 5

## 2013-09-23 MED ORDER — SODIUM CHLORIDE 0.9 % IJ SOLN
10.0000 mL | INTRAMUSCULAR | Status: DC | PRN
  Administered 2013-09-23: 10 mL
  Filled 2013-09-23: qty 10

## 2013-09-23 NOTE — Telephone Encounter (Signed)
Patient did not see a financial advocate today as recommended to [place] with chemotherapy follow up call.  Called Dietician requesting resources to help with cost of Osmolyte.  Also mentioned patient taking oral intake and vomiting.

## 2013-09-30 ENCOUNTER — Telehealth: Payer: Self-pay | Admitting: *Deleted

## 2013-09-30 NOTE — Telephone Encounter (Signed)
Call from pt reporting his stomach is making loud noises. Having some abdominal pain that is relieved with pain med. Bowels are moving Q 2-3 days. Does not feel constipated. Vomited several times in last 2 days. Zofran is helping nausea. Reviewed with Dr. Benay Spice: May try GasX to see if this helps the bowel sounds. Continue pain med PRN. Pt voiced understanding. Stated he wanted to cancel chemo for 2/18 (feels like chemo is still in his system.) Encouraged him to keep appts as scheduled for now. He agrees to do so.

## 2013-10-02 ENCOUNTER — Other Ambulatory Visit: Payer: Self-pay | Admitting: Oncology

## 2013-10-04 ENCOUNTER — Other Ambulatory Visit: Payer: Self-pay | Admitting: Oncology

## 2013-10-05 ENCOUNTER — Other Ambulatory Visit: Payer: Self-pay | Admitting: Oncology

## 2013-10-05 ENCOUNTER — Telehealth: Payer: Self-pay | Admitting: Oncology

## 2013-10-05 ENCOUNTER — Encounter: Payer: BC Managed Care – PPO | Admitting: Nutrition

## 2013-10-05 ENCOUNTER — Ambulatory Visit (HOSPITAL_BASED_OUTPATIENT_CLINIC_OR_DEPARTMENT_OTHER): Payer: BC Managed Care – PPO | Admitting: Nurse Practitioner

## 2013-10-05 ENCOUNTER — Telehealth: Payer: Self-pay | Admitting: *Deleted

## 2013-10-05 ENCOUNTER — Inpatient Hospital Stay: Payer: BC Managed Care – PPO

## 2013-10-05 ENCOUNTER — Other Ambulatory Visit: Payer: Self-pay | Admitting: *Deleted

## 2013-10-05 ENCOUNTER — Other Ambulatory Visit (HOSPITAL_BASED_OUTPATIENT_CLINIC_OR_DEPARTMENT_OTHER): Payer: BC Managed Care – PPO

## 2013-10-05 VITALS — BP 107/62 | HR 95 | Temp 97.4°F | Resp 18 | Ht 63.0 in | Wt 96.9 lb

## 2013-10-05 DIAGNOSIS — R112 Nausea with vomiting, unspecified: Secondary | ICD-10-CM

## 2013-10-05 DIAGNOSIS — R630 Anorexia: Secondary | ICD-10-CM

## 2013-10-05 DIAGNOSIS — C772 Secondary and unspecified malignant neoplasm of intra-abdominal lymph nodes: Secondary | ICD-10-CM

## 2013-10-05 DIAGNOSIS — C169 Malignant neoplasm of stomach, unspecified: Secondary | ICD-10-CM

## 2013-10-05 DIAGNOSIS — R634 Abnormal weight loss: Secondary | ICD-10-CM

## 2013-10-05 DIAGNOSIS — C164 Malignant neoplasm of pylorus: Secondary | ICD-10-CM

## 2013-10-05 DIAGNOSIS — G893 Neoplasm related pain (acute) (chronic): Secondary | ICD-10-CM

## 2013-10-05 DIAGNOSIS — C165 Malignant neoplasm of lesser curvature of stomach, unspecified: Secondary | ICD-10-CM

## 2013-10-05 LAB — CBC WITH DIFFERENTIAL/PLATELET
BASO%: 0.3 % (ref 0.0–2.0)
Basophils Absolute: 0 10*3/uL (ref 0.0–0.1)
EOS ABS: 0.3 10*3/uL (ref 0.0–0.5)
EOS%: 4.3 % (ref 0.0–7.0)
HCT: 40.7 % (ref 38.4–49.9)
HGB: 13.8 g/dL (ref 13.0–17.1)
LYMPH#: 3.1 10*3/uL (ref 0.9–3.3)
LYMPH%: 43 % (ref 14.0–49.0)
MCH: 32.9 pg (ref 27.2–33.4)
MCHC: 33.9 g/dL (ref 32.0–36.0)
MCV: 97.1 fL (ref 79.3–98.0)
MONO#: 1.1 10*3/uL — ABNORMAL HIGH (ref 0.1–0.9)
MONO%: 15.5 % — ABNORMAL HIGH (ref 0.0–14.0)
NEUT%: 36.9 % — ABNORMAL LOW (ref 39.0–75.0)
NEUTROS ABS: 2.7 10*3/uL (ref 1.5–6.5)
PLATELETS: 432 10*3/uL — AB (ref 140–400)
RBC: 4.19 10*6/uL — ABNORMAL LOW (ref 4.20–5.82)
RDW: 14.2 % (ref 11.0–14.6)
WBC: 7.3 10*3/uL (ref 4.0–10.3)
nRBC: 0 % (ref 0–0)

## 2013-10-05 LAB — TECHNOLOGIST REVIEW

## 2013-10-05 MED ORDER — OXYCODONE HCL 5 MG/5ML PO SOLN: 10.0000 mg | Freq: Four times a day (QID) | ORAL | Status: DC | PRN

## 2013-10-05 MED ORDER — LORAZEPAM 0.5 MG PO TABS: 0.5000 mg | ORAL_TABLET | Freq: Three times a day (TID) | ORAL | Status: DC | PRN

## 2013-10-05 NOTE — Progress Notes (Signed)
OFFICE PROGRESS NOTE  Interval history:  Evan Peterson returns for follow-up metastatic gastric cancer. He completed cycle 1 FOLFOX on 09/21/2013. He continues to have intermittent nausea, question vomiting versus oral secretions. He had "a little diarrhea". He intermittently feels cold and hot. He is tired. He denies any numbness or tingling in his hands or feet. No mouth sores. No oral intake. He continues tube feedings.   Objective: Filed Vitals:   10/05/13 1037  BP: 107/62  Pulse: 95  Temp: 97.4 F (36.3 C)  Resp: 18   Oropharynx is without thrush or ulceration. Lungs are clear. Regular cardiac rhythm. Port-A-Cath site without erythema. Marked tenderness over the upper mid abdomen. Jejunostomy tube site is without erythema. No leg edema.   Lab Results: Lab Results  Component Value Date   WBC 7.3 10/05/2013   HGB 13.8 10/05/2013   HCT 40.7 10/05/2013   MCV 97.1 10/05/2013   PLT 432* 10/05/2013   NEUTROABS 2.7 10/05/2013    Chemistry:    Chemistry      Component Value Date/Time   NA 138 08/26/2013 1710   K 4.5 08/26/2013 1710   CL 97 08/26/2013 1710   CO2 29 08/26/2013 1710   BUN 16 08/26/2013 1710   CREATININE 0.68 08/26/2013 1710      Component Value Date/Time   CALCIUM 8.9 08/26/2013 1710   ALKPHOS 66 08/26/2013 1710   AST 27 08/26/2013 1710   ALT 40 08/26/2013 1710   BILITOT 0.3 08/26/2013 1710       Studies/Results: Dg Chest Port 1 View  09/14/2013   CLINICAL DATA:  Status post Port-A-Cath insertion.  EXAM: PORTABLE CHEST - 1 VIEW  COMPARISON:  Chest x-ray 08/26/2013.  FINDINGS: New left-sided subclavian single-lumen porta cath with tip terminating at the superior cavoatrial junction. No pneumothorax. Lung volumes are normal. No consolidative airspace disease. No pleural effusions. No pulmonary nodule or mass noted. Pulmonary vasculature and the cardiomediastinal silhouette are within normal limits.  IMPRESSION: 1. New left subclavian single-lumen porta cath appears properly located. No  acute complicating features.   Electronically Signed   By: Vinnie Langton M.D.   On: 09/14/2013 12:57   Dg C-arm 1-60 Min-no Report  09/14/2013   CLINICAL DATA: port a cath   C-ARM 1-60 MINUTES  Fluoroscopy was utilized by the requesting physician.  No radiographic  interpretation.     Medications: I have reviewed the patient's current medications.  Assessment/Plan: 1. Metastatic gastric cancer, stage IV. Status post diagnostic laparoscopy 08/05/2013 confirming carcinomatosis with biopsies of a transverse colon lymph node and small bowel mesentery nodule confirming metastatic poorly differentiated adenocarcinoma.  Lesser curvature gastric mass confirmed on endoscopy 07/12/2013 with a biopsy revealing poorly differentiated adenocarcinoma. HER-2 positive. Cycle 1 FOLFOX 09/21/2013.  2. Anorexia/weight loss and malnutrition. Maintained on jejunostomy tube feedings. 3. Pain secondary to gastric cancer. Currently taking liquid oxycodone (2m/5ml) 10 ml every 6 hours as needed.  4. Intermittent nausea and vomiting secondary to gastric cancer. He takes Zofran as needed.  5. Port-A-Cath placement by Dr. BBarry Dienes01/28/2015.  Dispositon-he has completed 1 cycle of FOLFOX. He requests cycle 2 be held today and rescheduled for one week. We will adjust the premedication regimen to include Aloxi and Emend beginning with cycle 2. He was given a prescription for Ativan 0.5 mg sublingual as needed for nausea.  Herceptin will be added beginning with cycle 2. He was referred for a 2-D echo.  We will see him in followup prior to cycle 3 on  10/27/2013. He will contact the office in the interim with any problems. An interpreter was present throughout today's visit.   Patient seen with Dr. Benay Spice.   Evan Peterson ANP/GNP-BC   This was a shared visit with Evan Peterson. It is unclear whether the nausea is related to chemotherapy or gastric cancer.We will adjust the anti-emetic regimen with cycle 2. He will  return for cycle 2 FOLFOX in one week.  Evan Peterson, M.D.

## 2013-10-05 NOTE — Telephone Encounter (Signed)
Pt requested re-fill pain medicine Oxycodone 5mg  "will be out in 2 days"; notified pt that script will be ready for p/u 10/06/13.  Pt verbalized understanding.

## 2013-10-05 NOTE — Telephone Encounter (Signed)
Gave pt appt for lab, chemo pump dc and MD for February ansd MArch 2015

## 2013-10-05 NOTE — Telephone Encounter (Signed)
Per staff message and POF I have scheduled appts.  JMW  

## 2013-10-11 ENCOUNTER — Telehealth: Payer: Self-pay | Admitting: *Deleted

## 2013-10-11 NOTE — Telephone Encounter (Signed)
Inquiry by Lenna Sciara, pharmacy if MD still plans to give Herceptin tomorrow? ECHO not yet scheduled. Per Dr. Benay Spice, [place] to give one dose Herceptin tomorrow prior to ECHO. Called [facility] lab: ECHO scheduled for 10/17/13 at 0945/1000-register in admitting at [facility]. Notified Benedetto Goad in managed care to do precert. Will make patient aware of appointment tomorrow-notified scheduler that interpreter is needed for this appointment.

## 2013-10-12 ENCOUNTER — Other Ambulatory Visit (HOSPITAL_BASED_OUTPATIENT_CLINIC_OR_DEPARTMENT_OTHER): Payer: BC Managed Care – PPO

## 2013-10-12 ENCOUNTER — Ambulatory Visit (HOSPITAL_BASED_OUTPATIENT_CLINIC_OR_DEPARTMENT_OTHER): Payer: BC Managed Care – PPO

## 2013-10-12 ENCOUNTER — Other Ambulatory Visit: Payer: Self-pay | Admitting: *Deleted

## 2013-10-12 VITALS — BP 96/63 | HR 83 | Temp 97.9°F | Resp 17

## 2013-10-12 DIAGNOSIS — C169 Malignant neoplasm of stomach, unspecified: Secondary | ICD-10-CM

## 2013-10-12 DIAGNOSIS — Z5112 Encounter for antineoplastic immunotherapy: Secondary | ICD-10-CM

## 2013-10-12 DIAGNOSIS — C165 Malignant neoplasm of lesser curvature of stomach, unspecified: Secondary | ICD-10-CM

## 2013-10-12 DIAGNOSIS — Z5111 Encounter for antineoplastic chemotherapy: Secondary | ICD-10-CM

## 2013-10-12 LAB — CBC WITH DIFFERENTIAL/PLATELET
BASO%: 0.5 % (ref 0.0–2.0)
BASOS ABS: 0 10*3/uL (ref 0.0–0.1)
EOS ABS: 0.4 10*3/uL (ref 0.0–0.5)
EOS%: 3.7 % (ref 0.0–7.0)
HCT: 40 % (ref 38.4–49.9)
HEMOGLOBIN: 13.6 g/dL (ref 13.0–17.1)
LYMPH%: 33.5 % (ref 14.0–49.0)
MCH: 33.7 pg — AB (ref 27.2–33.4)
MCHC: 34 g/dL (ref 32.0–36.0)
MCV: 99.1 fL — AB (ref 79.3–98.0)
MONO#: 1.8 10*3/uL — ABNORMAL HIGH (ref 0.1–0.9)
MONO%: 18.3 % — AB (ref 0.0–14.0)
NEUT%: 44 % (ref 39.0–75.0)
NEUTROS ABS: 4.5 10*3/uL (ref 1.5–6.5)
Platelets: 534 10*3/uL — ABNORMAL HIGH (ref 140–400)
RBC: 4.03 10*6/uL — ABNORMAL LOW (ref 4.20–5.82)
RDW: 14.7 % — ABNORMAL HIGH (ref 11.0–14.6)
WBC: 10.1 10*3/uL (ref 4.0–10.3)
lymph#: 3.4 10*3/uL — ABNORMAL HIGH (ref 0.9–3.3)

## 2013-10-12 LAB — TECHNOLOGIST REVIEW

## 2013-10-12 LAB — COMPREHENSIVE METABOLIC PANEL (CC13)
ALBUMIN: 3.2 g/dL — AB (ref 3.5–5.0)
ALK PHOS: 60 U/L (ref 40–150)
ALT: 42 U/L (ref 0–55)
AST: 27 U/L (ref 5–34)
Anion Gap: 8 mEq/L (ref 3–11)
BUN: 13.3 mg/dL (ref 7.0–26.0)
CO2: 30 mEq/L — ABNORMAL HIGH (ref 22–29)
Calcium: 9.2 mg/dL (ref 8.4–10.4)
Chloride: 98 mEq/L (ref 98–109)
Creatinine: 0.8 mg/dL (ref 0.7–1.3)
Glucose: 94 mg/dl (ref 70–140)
Potassium: 4.6 mEq/L (ref 3.5–5.1)
Sodium: 137 mEq/L (ref 136–145)
Total Protein: 6.9 g/dL (ref 6.4–8.3)

## 2013-10-12 MED ORDER — SODIUM CHLORIDE 0.9 % IV SOLN
150.0000 mg | Freq: Once | INTRAVENOUS | Status: AC
  Administered 2013-10-12: 150 mg via INTRAVENOUS
  Filled 2013-10-12: qty 5

## 2013-10-12 MED ORDER — FLUOROURACIL CHEMO INJECTION 2.5 GM/50ML
400.0000 mg/m2 | Freq: Once | INTRAVENOUS | Status: AC
  Administered 2013-10-12: 550 mg via INTRAVENOUS
  Filled 2013-10-12: qty 11

## 2013-10-12 MED ORDER — DIPHENHYDRAMINE HCL 25 MG PO CAPS
ORAL_CAPSULE | ORAL | Status: AC
  Filled 2013-10-12: qty 2

## 2013-10-12 MED ORDER — PALONOSETRON HCL INJECTION 0.25 MG/5ML
0.2500 mg | Freq: Once | INTRAVENOUS | Status: AC
  Administered 2013-10-12: 0.25 mg via INTRAVENOUS

## 2013-10-12 MED ORDER — ONDANSETRON 4 MG PO TBDP: 4.0000 mg | ORAL_TABLET | Freq: Three times a day (TID) | ORAL | Status: DC | PRN

## 2013-10-12 MED ORDER — DIPHENHYDRAMINE HCL 25 MG PO CAPS
50.0000 mg | ORAL_CAPSULE | Freq: Once | ORAL | Status: AC
  Administered 2013-10-12: 50 mg via ORAL

## 2013-10-12 MED ORDER — DEXTROSE 5 % IV SOLN
550.0000 mg | Freq: Once | INTRAVENOUS | Status: AC
  Administered 2013-10-12: 550 mg via INTRAVENOUS
  Filled 2013-10-12: qty 27.5

## 2013-10-12 MED ORDER — DEXAMETHASONE SODIUM PHOSPHATE 10 MG/ML IJ SOLN
INTRAMUSCULAR | Status: AC
  Filled 2013-10-12: qty 1

## 2013-10-12 MED ORDER — HEPARIN SOD (PORK) LOCK FLUSH 100 UNIT/ML IV SOLN
500.0000 [IU] | Freq: Once | INTRAVENOUS | Status: DC | PRN
  Filled 2013-10-12: qty 5

## 2013-10-12 MED ORDER — ACETAMINOPHEN 325 MG PO TABS
650.0000 mg | ORAL_TABLET | Freq: Once | ORAL | Status: AC
  Administered 2013-10-12: 650 mg via ORAL

## 2013-10-12 MED ORDER — ACETAMINOPHEN 325 MG PO TABS
ORAL_TABLET | ORAL | Status: AC
  Filled 2013-10-12: qty 2

## 2013-10-12 MED ORDER — PALONOSETRON HCL INJECTION 0.25 MG/5ML
INTRAVENOUS | Status: AC
  Filled 2013-10-12: qty 5

## 2013-10-12 MED ORDER — DEXAMETHASONE SODIUM PHOSPHATE 10 MG/ML IJ SOLN
10.0000 mg | Freq: Once | INTRAMUSCULAR | Status: AC
  Administered 2013-10-12: 10 mg via INTRAVENOUS

## 2013-10-12 MED ORDER — SODIUM CHLORIDE 0.9 % IV SOLN
2400.0000 mg/m2 | INTRAVENOUS | Status: DC
  Administered 2013-10-12: 3300 mg via INTRAVENOUS
  Filled 2013-10-12: qty 66

## 2013-10-12 MED ORDER — SODIUM CHLORIDE 0.9 % IJ SOLN
10.0000 mL | INTRAMUSCULAR | Status: DC | PRN
  Filled 2013-10-12: qty 10

## 2013-10-12 MED ORDER — SODIUM CHLORIDE 0.9 % IV SOLN
Freq: Once | INTRAVENOUS | Status: AC
  Administered 2013-10-12: 13:00:00 via INTRAVENOUS

## 2013-10-12 MED ORDER — DEXTROSE 5 % IV SOLN
85.0000 mg/m2 | Freq: Once | INTRAVENOUS | Status: AC
  Administered 2013-10-12: 115 mg via INTRAVENOUS
  Filled 2013-10-12: qty 23

## 2013-10-12 MED ORDER — TRASTUZUMAB CHEMO INJECTION 440 MG
6.0000 mg/kg | Freq: Once | INTRAVENOUS | Status: AC
  Administered 2013-10-12: 273 mg via INTRAVENOUS
  Filled 2013-10-12: qty 13

## 2013-10-12 MED ORDER — DEXTROSE 5 % IV SOLN
Freq: Once | INTRAVENOUS | Status: AC
  Administered 2013-10-12: 16:00:00 via INTRAVENOUS

## 2013-10-12 NOTE — Patient Instructions (Signed)
[facility] Discharge Instructions for Patients Receiving Chemotherapy  Today you received the following chemotherapy agents: Herceptin, Oxaliplatin, Leucovorin, 5FU (Adrucil)  To help prevent nausea and vomiting after your treatment, we encourage you to take your nausea medication: Zofran 8 mg every 8 hrs as needed.    You received Aloxi today for nausea, which lasts 72 hrs. Do not take zofran until 72 hrs has passed.    If you develop nausea and vomiting that is not controlled by your nausea medication, call the clinic.   BELOW ARE SYMPTOMS THAT SHOULD BE REPORTED IMMEDIATELY:  *FEVER GREATER THAN 100.5 F  *CHILLS WITH OR WITHOUT FEVER  NAUSEA AND VOMITING THAT IS NOT CONTROLLED WITH YOUR NAUSEA MEDICATION  *UNUSUAL SHORTNESS OF BREATH  *UNUSUAL BRUISING OR BLEEDING  TENDERNESS IN MOUTH AND THROAT WITH OR WITHOUT PRESENCE OF ULCERS  *URINARY PROBLEMS  *BOWEL PROBLEMS  UNUSUAL RASH Items with * indicate a potential emergency and should be followed up as soon as possible.  Feel free to call the clinic you have any questions or concerns. The clinic phone number is [phone].  Trastuzumab injection for infusion (Herceptin) What is this medicine? TRASTUZUMAB (tras TOO zoo mab) is a monoclonal antibody. It targets a protein called HER2. This protein is found in some stomach and breast cancers. This medicine can stop cancer cell growth. This medicine may be used with other cancer treatments. This medicine may be used for other purposes; ask your health care provider or pharmacist if you have questions. COMMON BRAND NAME(S): Herceptin What should I tell my health care provider before I take this medicine? They need to know if you have any of these conditions: -heart disease -heart failure -infection (especially a virus infection such as chickenpox, cold sores, or herpes) -lung or breathing disease, like asthma -recent or ongoing radiation therapy -an unusual  or allergic reaction to trastuzumab, benzyl alcohol, or other medications, foods, dyes, or preservatives -pregnant or trying to get pregnant -breast-feeding How should I use this medicine? This drug is given as an infusion into a vein. It is administered in a hospital or clinic by a specially trained health care professional. Talk to your pediatrician regarding the use of this medicine in children. This medicine is not approved for use in children. Overdosage: If you think you have taken too much of this medicine contact a poison control center or emergency room at once. NOTE: This medicine is only for you. Do not share this medicine with others. What if I miss a dose? It is important not to miss a dose. Call your doctor or health care professional if you are unable to keep an appointment. What may interact with this medicine? -cyclophosphamide -doxorubicin -warfarin This list may not describe all possible interactions. Give your health care provider a list of all the medicines, herbs, non-prescription drugs, or dietary supplements you use. Also tell them if you smoke, drink alcohol, or use illegal drugs. Some items may interact with your medicine. What should I watch for while using this medicine? Visit your doctor for checks on your progress. Report any side effects. Continue your course of treatment even though you feel ill unless your doctor tells you to stop. Call your doctor or health care professional for advice if you get a fever, chills or sore throat, or other symptoms of a cold or flu. Do not treat yourself. Try to avoid being around people who are sick. You may experience fever, chills and shaking during your first  infusion. These effects are usually mild and can be treated with other medicines. Report any side effects during the infusion to your health care professional. Fever and chills usually do not happen with later infusions. What side effects may I notice from receiving this  medicine? Side effects that you should report to your doctor or other health care professional as soon as possible: -breathing difficulties -chest pain or palpitations -cough -dizziness or fainting -fever or chills, sore throat -skin rash, itching or hives -swelling of the legs or ankles -unusually weak or tired Side effects that usually do not require medical attention (report to your doctor or other health care professional if they continue or are bothersome): -loss of appetite -headache -muscle aches -nausea This list may not describe all possible side effects. Call your doctor for medical advice about side effects. You may report side effects to FDA at 1-800-FDA-1088. Where should I keep my medicine? This drug is given in a hospital or clinic and will not be stored at home. NOTE: This sheet is a summary. It may not cover all possible information. If you have questions about this medicine, talk to your doctor, pharmacist, or health care provider.  2014, Elsevier/Gold Standard. (2009-06-08 13:43:15)

## 2013-10-13 ENCOUNTER — Telehealth: Payer: Self-pay | Admitting: *Deleted

## 2013-10-13 NOTE — Telephone Encounter (Signed)
Message copied by Cherylynn Ridges on Thu Oct 13, 2013 11:50 AM ------      Message from: Adalberto Cole      Created: Wed Oct 12, 2013  1:54 PM      Regarding: new chemo      Contact: [phone]       New Herceptin tx ------

## 2013-10-13 NOTE — Telephone Encounter (Signed)
Called Kaiyden YAMIR CARIGNAN at [facility]) [phone]..  Message left requesting a return call for chemotherapy follow up.  Awaiting return call from patient.

## 2013-10-14 ENCOUNTER — Ambulatory Visit (HOSPITAL_BASED_OUTPATIENT_CLINIC_OR_DEPARTMENT_OTHER): Payer: BC Managed Care – PPO

## 2013-10-14 VITALS — BP 112/65 | HR 96 | Temp 98.1°F

## 2013-10-14 DIAGNOSIS — C165 Malignant neoplasm of lesser curvature of stomach, unspecified: Secondary | ICD-10-CM

## 2013-10-14 DIAGNOSIS — C169 Malignant neoplasm of stomach, unspecified: Secondary | ICD-10-CM

## 2013-10-14 DIAGNOSIS — C772 Secondary and unspecified malignant neoplasm of intra-abdominal lymph nodes: Secondary | ICD-10-CM

## 2013-10-14 MED ORDER — SODIUM CHLORIDE 0.9 % IJ SOLN
10.0000 mL | INTRAMUSCULAR | Status: DC | PRN
  Administered 2013-10-14: 10 mL
  Filled 2013-10-14: qty 10

## 2013-10-14 MED ORDER — HEPARIN SOD (PORK) LOCK FLUSH 100 UNIT/ML IV SOLN
500.0000 [IU] | Freq: Once | INTRAVENOUS | Status: AC | PRN
  Administered 2013-10-14: 500 [IU]
  Filled 2013-10-14: qty 5

## 2013-10-17 ENCOUNTER — Ambulatory Visit (HOSPITAL_COMMUNITY)
Admission: RE | Admit: 2013-10-17 | Discharge: 2013-10-17 | Disposition: A | Discharge status: Home / Self Care | Payer: BC Managed Care – PPO | Source: Ambulatory Visit | Attending: Family Medicine | Admitting: Family Medicine

## 2013-10-17 DIAGNOSIS — C169 Malignant neoplasm of stomach, unspecified: Secondary | ICD-10-CM | POA: Insufficient documentation

## 2013-10-17 DIAGNOSIS — Z5111 Encounter for antineoplastic chemotherapy: Secondary | ICD-10-CM

## 2013-10-17 DIAGNOSIS — Z01818 Encounter for other preprocedural examination: Secondary | ICD-10-CM | POA: Insufficient documentation

## 2013-10-17 NOTE — Progress Notes (Signed)
Echocardiogram 2D Echocardiogram has been performed.  Evan Peterson 10/17/2013, 10:56 AM

## 2013-10-19 ENCOUNTER — Encounter: Payer: BC Managed Care – PPO | Admitting: Nutrition

## 2013-10-19 ENCOUNTER — Other Ambulatory Visit: Payer: BC Managed Care – PPO

## 2013-10-19 ENCOUNTER — Ambulatory Visit: Payer: BC Managed Care – PPO | Admitting: Oncology

## 2013-10-19 ENCOUNTER — Inpatient Hospital Stay: Payer: BC Managed Care – PPO

## 2013-10-21 ENCOUNTER — Other Ambulatory Visit: Payer: Self-pay | Admitting: Oncology

## 2013-10-26 ENCOUNTER — Encounter: Payer: Self-pay | Admitting: *Deleted

## 2013-10-27 ENCOUNTER — Telehealth: Payer: Self-pay | Admitting: Oncology

## 2013-10-27 ENCOUNTER — Encounter: Payer: BC Managed Care – PPO | Admitting: Nutrition

## 2013-10-27 ENCOUNTER — Ambulatory Visit: Payer: BC Managed Care – PPO

## 2013-10-27 ENCOUNTER — Ambulatory Visit (HOSPITAL_BASED_OUTPATIENT_CLINIC_OR_DEPARTMENT_OTHER): Payer: BC Managed Care – PPO | Admitting: Nurse Practitioner

## 2013-10-27 ENCOUNTER — Telehealth: Payer: Self-pay | Admitting: *Deleted

## 2013-10-27 ENCOUNTER — Other Ambulatory Visit (HOSPITAL_BASED_OUTPATIENT_CLINIC_OR_DEPARTMENT_OTHER): Payer: BC Managed Care – PPO

## 2013-10-27 VITALS — BP 122/78 | HR 80 | Temp 97.9°F | Resp 18 | Ht 63.0 in | Wt 102.4 lb

## 2013-10-27 DIAGNOSIS — C165 Malignant neoplasm of lesser curvature of stomach, unspecified: Secondary | ICD-10-CM

## 2013-10-27 DIAGNOSIS — C772 Secondary and unspecified malignant neoplasm of intra-abdominal lymph nodes: Secondary | ICD-10-CM

## 2013-10-27 DIAGNOSIS — R634 Abnormal weight loss: Secondary | ICD-10-CM

## 2013-10-27 DIAGNOSIS — G893 Neoplasm related pain (acute) (chronic): Secondary | ICD-10-CM

## 2013-10-27 DIAGNOSIS — E46 Unspecified protein-calorie malnutrition: Secondary | ICD-10-CM

## 2013-10-27 DIAGNOSIS — C169 Malignant neoplasm of stomach, unspecified: Secondary | ICD-10-CM

## 2013-10-27 DIAGNOSIS — R630 Anorexia: Secondary | ICD-10-CM

## 2013-10-27 DIAGNOSIS — D709 Neutropenia, unspecified: Secondary | ICD-10-CM

## 2013-10-27 LAB — CBC WITH DIFFERENTIAL/PLATELET
BASO%: 0.4 % (ref 0.0–2.0)
Basophils Absolute: 0 10*3/uL (ref 0.0–0.1)
EOS ABS: 0.4 10*3/uL (ref 0.0–0.5)
EOS%: 5.9 % (ref 0.0–7.0)
HCT: 37.6 % — ABNORMAL LOW (ref 38.4–49.9)
HGB: 12.8 g/dL — ABNORMAL LOW (ref 13.0–17.1)
LYMPH#: 3.9 10*3/uL — AB (ref 0.9–3.3)
LYMPH%: 55.5 % — ABNORMAL HIGH (ref 14.0–49.0)
MCH: 33.2 pg (ref 27.2–33.4)
MCHC: 34 g/dL (ref 32.0–36.0)
MCV: 97.7 fL (ref 79.3–98.0)
MONO#: 1.6 10*3/uL — AB (ref 0.1–0.9)
MONO%: 22.5 % — ABNORMAL HIGH (ref 0.0–14.0)
NEUT%: 15.7 % — ABNORMAL LOW (ref 39.0–75.0)
NEUTROS ABS: 1.1 10*3/uL — AB (ref 1.5–6.5)
NRBC: 0 % (ref 0–0)
Platelets: 474 10*3/uL — ABNORMAL HIGH (ref 140–400)
RBC: 3.85 10*6/uL — ABNORMAL LOW (ref 4.20–5.82)
RDW: 14.8 % — AB (ref 11.0–14.6)
WBC: 7 10*3/uL (ref 4.0–10.3)

## 2013-10-27 LAB — TECHNOLOGIST REVIEW

## 2013-10-27 MED ORDER — LORAZEPAM 0.5 MG PO TABS: 0.5000 mg | ORAL_TABLET | Freq: Three times a day (TID) | ORAL | Status: DC | PRN

## 2013-10-27 MED ORDER — OXYCODONE HCL 5 MG/5ML PO SOLN: 10.0000 mg | Freq: Four times a day (QID) | ORAL | Status: DC | PRN

## 2013-10-27 NOTE — Progress Notes (Signed)
OFFICE PROGRESS NOTE  Interval history:  Mr. Evan Peterson returns for followup of metastatic gastric cancer. He completed cycle 2 FOLFOX and cycle 1 Herceptin on 10/12/2013. He reports continued intermittent nausea, question vomiting versus oral secretions. Intermittent loose stools. He continues to feel hot and cold. He feels the chemotherapy is causing fatigue. The cold sensitivity lasted 3-4 days. He denies persistent neuropathy symptoms. He continues tube feedings. Minimal oral intake. He reports intermittent abdominal pain. He is taking pain medication about 3 times a day.   Objective: Filed Vitals:   10/27/13 1047  BP: 122/78  Pulse: 80  Temp: 97.9 F (36.6 C)  Resp: 18   No thrush. Lungs are clear. Regular cardiac rhythm. Port-A-Cath site without erythema. Upper abdomen is tender. Jejunostomy tube site is without erythema. No leg edema.   Lab Results: Lab Results  Component Value Date   WBC 7.0 10/27/2013   HGB 12.8* 10/27/2013   HCT 37.6* 10/27/2013   MCV 97.7 10/27/2013   PLT 474* 10/27/2013   NEUTROABS 1.1* 10/27/2013    Chemistry:    Chemistry      Component Value Date/Time   NA 137 10/12/2013 1240   NA 138 08/26/2013 1710   K 4.6 10/12/2013 1240   K 4.5 08/26/2013 1710   CL 97 08/26/2013 1710   CO2 30* 10/12/2013 1240   CO2 29 08/26/2013 1710   BUN 13.3 10/12/2013 1240   BUN 16 08/26/2013 1710   CREATININE 0.8 10/12/2013 1240   CREATININE 0.68 08/26/2013 1710      Component Value Date/Time   CALCIUM 9.2 10/12/2013 1240   CALCIUM 8.9 08/26/2013 1710   ALKPHOS 60 10/12/2013 1240   ALKPHOS 66 08/26/2013 1710   AST 27 10/12/2013 1240   AST 27 08/26/2013 1710   ALT 42 10/12/2013 1240   ALT 40 08/26/2013 1710   BILITOT <0.20 10/12/2013 1240   BILITOT 0.3 08/26/2013 1710       Studies/Results: No results found.  Medications: I have reviewed the patient's current medications.  Assessment/Plan: 1. Metastatic gastric cancer, stage IV. Status post diagnostic laparoscopy 08/05/2013 confirming  carcinomatosis with biopsies of a transverse colon lymph node and small bowel mesentery nodule confirming metastatic poorly differentiated adenocarcinoma.  Lesser curvature gastric mass confirmed on endoscopy 07/12/2013 with a biopsy revealing poorly differentiated adenocarcinoma. HER-2 positive.  Cycle 1 FOLFOX 09/21/2013.  Cycle 2 FOLFOX/initiation of Herceptin 10/12/2013. 2. Anorexia/weight loss and malnutrition. Maintained on jejunostomy tube feedings. He is gaining weight 3. Pain secondary to gastric cancer. Currently taking liquid oxycodone (61m/5ml) 10 ml every 6 hours as needed.  4. Intermittent nausea and vomiting secondary to gastric cancer. He takes Zofran as needed.  5. Port-A-Cath placement by Dr. BBarry Dienes01/28/2015. 6. 2-D echo 10/17/2013. Cavity size was normal. Systolic function was normal. Wall motion normal. No regional wall motion abnormalities.  Dispositon-he appears stable. He has completed 2 cycles of FOLFOX. Herceptin was initiated with cycle 2. Labs today show neutropenia. We will hold today's treatment and reschedule for one week. We discussed Neulasta on the day of pump discontinuation. We reviewed potential toxicities associated with Neulasta including arthralgias, skin rash, splenic rupture. He is agreeable.  He will return for cycle 3 FOLFOX/Herceptin on 11/03/2013. We will see him in followup on 11/17/2013. New prescriptions were given for oxycodone and Ativan.   TNed CardANP/GNP-BC

## 2013-10-27 NOTE — Telephone Encounter (Signed)
Per staff message and POF I have scheduled appts.  JMW  

## 2013-10-27 NOTE — Telephone Encounter (Signed)
Gave pt appt for lab and MD for MArch and April today , cancelled chemo per ML, emailed Sharyn Lull regarding

## 2013-10-31 ENCOUNTER — Telehealth (INDEPENDENT_AMBULATORY_CARE_PROVIDER_SITE_OTHER): Payer: Self-pay | Admitting: *Deleted

## 2013-10-31 ENCOUNTER — Telehealth (INDEPENDENT_AMBULATORY_CARE_PROVIDER_SITE_OTHER): Payer: Self-pay | Admitting: General Surgery

## 2013-10-31 ENCOUNTER — Telehealth: Payer: Self-pay | Admitting: Oncology

## 2013-10-31 NOTE — Telephone Encounter (Signed)
Pt called said he was having some leakage at the incision site.  Please advise.Anderson Malta

## 2013-10-31 NOTE — Telephone Encounter (Signed)
Called pt per request of Dr. Barry Dienes.  Pt states "YES" he is still using the tube for feeding.  It is slipping in and out now as well, but never answered if is coming completely out or not.  Explained in minute detail how to clean and dry the skin around the tubing, apply Desitin as a barrier and then cover with gauze to protect his clothing.  Will update Dr. Barry Dienes regarding the feeding tube.

## 2013-10-31 NOTE — Telephone Encounter (Signed)
gave pt appt for lab and Md for MArch, advised pt to get appt  calendar for  march and April 2015

## 2013-11-01 ENCOUNTER — Telehealth (INDEPENDENT_AMBULATORY_CARE_PROVIDER_SITE_OTHER): Payer: Self-pay

## 2013-11-01 ENCOUNTER — Telehealth: Payer: Self-pay | Admitting: *Deleted

## 2013-11-01 NOTE — Telephone Encounter (Signed)
Pt called concerned about blood and possible tube feeding leaking around his feeding tube.  He has already contacted Dr Marlowe Aschoff office twice regarding this.  Pt wanted to know whether this would effect getting chemotherapy on 3/19.  Spoke with Ned Card, NP.  She states it is okay for him to proceed with chemotherapy on 3/19, he is to update the infusion RN if anything has changed or if there are any updates from Dr Barry Dienes.  Pt verbalized understanding.  SLJ

## 2013-11-01 NOTE — Telephone Encounter (Signed)
Patient calling into office concerned with his Feeding Tube leaking.  Patient states he called and spoke to triage yesterday regarding the leak.  He tried the Desitin but, states that there's more skin breakdown today.  Patient very concerned and would like a call back to discuss what Dr. Marlowe Aschoff recommends

## 2013-11-01 NOTE — Telephone Encounter (Signed)
Called the patient and advised him that Dr. Barry Dienes will be made aware and we will call him tomorrow morning with any instructions.

## 2013-11-03 ENCOUNTER — Other Ambulatory Visit: Payer: Self-pay | Admitting: Nurse Practitioner

## 2013-11-03 ENCOUNTER — Ambulatory Visit: Payer: BC Managed Care – PPO | Admitting: Nutrition

## 2013-11-03 ENCOUNTER — Ambulatory Visit (HOSPITAL_BASED_OUTPATIENT_CLINIC_OR_DEPARTMENT_OTHER): Payer: BC Managed Care – PPO

## 2013-11-03 ENCOUNTER — Other Ambulatory Visit (HOSPITAL_BASED_OUTPATIENT_CLINIC_OR_DEPARTMENT_OTHER): Payer: BC Managed Care – PPO

## 2013-11-03 ENCOUNTER — Telehealth: Payer: Self-pay | Admitting: Medical Oncology

## 2013-11-03 VITALS — Temp 97.9°F

## 2013-11-03 DIAGNOSIS — C169 Malignant neoplasm of stomach, unspecified: Secondary | ICD-10-CM

## 2013-11-03 DIAGNOSIS — R112 Nausea with vomiting, unspecified: Secondary | ICD-10-CM

## 2013-11-03 DIAGNOSIS — D709 Neutropenia, unspecified: Secondary | ICD-10-CM

## 2013-11-03 DIAGNOSIS — C165 Malignant neoplasm of lesser curvature of stomach, unspecified: Secondary | ICD-10-CM

## 2013-11-03 DIAGNOSIS — R5381 Other malaise: Secondary | ICD-10-CM

## 2013-11-03 DIAGNOSIS — R5383 Other fatigue: Secondary | ICD-10-CM

## 2013-11-03 DIAGNOSIS — L089 Local infection of the skin and subcutaneous tissue, unspecified: Secondary | ICD-10-CM

## 2013-11-03 LAB — CBC WITH DIFFERENTIAL/PLATELET
BASO%: 1.2 % (ref 0.0–2.0)
Basophils Absolute: 0.2 10*3/uL — ABNORMAL HIGH (ref 0.0–0.1)
EOS%: 3.5 % (ref 0.0–7.0)
Eosinophils Absolute: 0.6 10*3/uL — ABNORMAL HIGH (ref 0.0–0.5)
HCT: 38.8 % (ref 38.4–49.9)
HGB: 13.1 g/dL (ref 13.0–17.1)
LYMPH#: 3.6 10*3/uL — AB (ref 0.9–3.3)
LYMPH%: 22.9 % (ref 14.0–49.0)
MCH: 33.1 pg (ref 27.2–33.4)
MCHC: 33.8 g/dL (ref 32.0–36.0)
MCV: 98 fL (ref 79.3–98.0)
MONO#: 2.9 10*3/uL — AB (ref 0.1–0.9)
MONO%: 18.2 % — ABNORMAL HIGH (ref 0.0–14.0)
NEUT#: 8.5 10*3/uL — ABNORMAL HIGH (ref 1.5–6.5)
NEUT%: 54.2 % (ref 39.0–75.0)
PLATELETS: 523 10*3/uL — AB (ref 140–400)
RBC: 3.96 10*6/uL — AB (ref 4.20–5.82)
RDW: 15 % — ABNORMAL HIGH (ref 11.0–14.6)
WBC: 15.7 10*3/uL — AB (ref 4.0–10.3)
nRBC: 0 % (ref 0–0)

## 2013-11-03 LAB — COMPREHENSIVE METABOLIC PANEL (CC13)
ALT: 42 U/L (ref 0–55)
AST: 30 U/L (ref 5–34)
Albumin: 2.9 g/dL — ABNORMAL LOW (ref 3.5–5.0)
Alkaline Phosphatase: 68 U/L (ref 40–150)
Anion Gap: 10 mEq/L (ref 3–11)
BUN: 11.7 mg/dL (ref 7.0–26.0)
CALCIUM: 9.3 mg/dL (ref 8.4–10.4)
CHLORIDE: 99 meq/L (ref 98–109)
CO2: 27 mEq/L (ref 22–29)
Creatinine: 0.7 mg/dL (ref 0.7–1.3)
Glucose: 103 mg/dl (ref 70–140)
Potassium: 4.6 mEq/L (ref 3.5–5.1)
Sodium: 136 mEq/L (ref 136–145)
Total Bilirubin: <0.2 mg/dL (ref 0.20–1.20)
Total Protein: 7 g/dL (ref 6.4–8.3)

## 2013-11-03 LAB — TECHNOLOGIST REVIEW

## 2013-11-03 MED ORDER — HEPARIN SOD (PORK) LOCK FLUSH 100 UNIT/ML IV SOLN
500.0000 [IU] | Freq: Once | INTRAVENOUS | Status: AC
  Administered 2013-11-03: 500 [IU] via INTRAVENOUS
  Filled 2013-11-03: qty 5

## 2013-11-03 MED ORDER — SODIUM CHLORIDE 0.9 % IV SOLN
INTRAVENOUS | Status: DC
  Administered 2013-11-03: 11:00:00 via INTRAVENOUS

## 2013-11-03 MED ORDER — SULFAMETHOXAZOLE-TRIMETHOPRIM 200-40 MG/5ML PO SUSP: 20.0000 mL | Freq: Two times a day (BID) | ORAL | Status: DC

## 2013-11-03 MED ORDER — SODIUM CHLORIDE 0.9 % IJ SOLN
10.0000 mL | INTRAMUSCULAR | Status: DC | PRN
  Administered 2013-11-03: 10 mL via INTRAVENOUS
  Filled 2013-11-03: qty 10

## 2013-11-03 NOTE — Telephone Encounter (Signed)
Dr Barry Dienes will see pt tomorrow at 1000. I called pt (via [facility]) and he will go to  appointment.  I also called local interpretor to arrange for tomorrows appointment. Note to dr Benay Spice and his nurses.

## 2013-11-03 NOTE — Progress Notes (Signed)
Follow up completed with patient in infusion room.  Patient had interpreter present.  He reports his TF is going well and his weight increased to 102.4 on March 12.  Patient reports his feeding tube does get clogged occasionally.  He has been able to put a "stick" in it to unclog it.  Nutrition Diagnosis:  Unintended weight loss improved.  Intervention:  Educated patient on proper method of unclogging feeding tube.  Encouraged additional free water flushes during the day.  Patient educated to flush with 3 tubes water (180 cc)  before and after continuous feedings.  TF to remain the same. Teach back method used.  Monitoring, Evaluation, Goals:  Patient continues to tolerate TF at goal rate for weight gain.  Next Visit: Will continue to follow patient as needed through out treatment.

## 2013-11-03 NOTE — Telephone Encounter (Signed)
Scheduled pt to see Dr. Barry Dienes on 11/04/13.  Unable to reach patient with this information.  Will keep trying.

## 2013-11-03 NOTE — Progress Notes (Signed)
Pt arrived to infusion room c/o not feeling well today and requesting to not have his chemotherapy today.  C/o increased fatigue and feels like he has fevers at home d/t feeling hot and cold but has not checked his temperature at home.  Afebrile at this moment. Pt also c/o n/v and one vomiting episode this morning.  Pt also c/o J tube still painful.  Site has aprox 1 to 2 cm of redness around site w/ some dry, crusty green drainage on tube at site.  Pt reports the redness is much improved since last week.  He describes the redness as extending several inches around site last week.  He has not had any success in getting appt with Surgeons office to assess the site.  Dr. Benay Spice notified of above and order to hold chemo today and give IVFs.    Pt denied need for any nausea or pain meds at this time.  He requests refill on his oxycodone for home.  Ned Card, NP came over and assessed pt and his J tube.  She prescribed antibiotic for probable infection at J tube site.  She informed pt it is too soon for refill of oxycodone,  He should have plenty left at home.  He was instructed to call if he runs low.

## 2013-11-03 NOTE — Patient Instructions (Signed)
Dehydration, Adult Dehydration is when you lose more fluids from the body than you take in. Vital organs like the kidneys, brain, and heart cannot function without a proper amount of fluids and salt. Any loss of fluids from the body can cause dehydration.  CAUSES   Vomiting.  Diarrhea.  Excessive sweating.  Excessive urine output.  Fever. SYMPTOMS  Mild dehydration  Thirst.  Dry lips.  Slightly dry mouth. Moderate dehydration  Very dry mouth.  Sunken eyes.  Skin does not bounce back quickly when lightly pinched and released.  Dark urine and decreased urine production.  Decreased tear production.  Headache. Severe dehydration  Very dry mouth.  Extreme thirst.  Rapid, weak pulse (more than 100 beats per minute at rest).  Cold hands and feet.  Not able to sweat in spite of heat and temperature.  Rapid breathing.  Blue lips.  Confusion and lethargy.  Difficulty being awakened.  Minimal urine production.  No tears. DIAGNOSIS  Your caregiver will diagnose dehydration based on your symptoms and your exam. Blood and urine tests will help confirm the diagnosis. The diagnostic evaluation should also identify the cause of dehydration. TREATMENT  Treatment of mild or moderate dehydration can often be done at home by increasing the amount of fluids that you drink. It is best to drink small amounts of fluid more often. Drinking too much at one time can make vomiting worse. Refer to the home care instructions below. Severe dehydration needs to be treated at the hospital where you will probably be given intravenous (IV) fluids that contain water and electrolytes. HOME CARE INSTRUCTIONS   Ask your caregiver about specific rehydration instructions.  Drink enough fluids to keep your urine clear or pale yellow.  Drink small amounts frequently if you have nausea and vomiting.  Eat as you normally do.  Avoid:  Foods or drinks high in sugar.  Carbonated  drinks.  Juice.  Extremely hot or cold fluids.  Drinks with caffeine.  Fatty, greasy foods.  Alcohol.  Tobacco.  Overeating.  Gelatin desserts.  Wash your hands well to avoid spreading bacteria and viruses.  Only take over-the-counter or prescription medicines for pain, discomfort, or fever as directed by your caregiver.  Ask your caregiver if you should continue all prescribed and over-the-counter medicines.  Keep all follow-up appointments with your caregiver. SEEK MEDICAL CARE IF:  You have abdominal pain and it increases or stays in one area (localizes).  You have a rash, stiff neck, or severe headache.  You are irritable, sleepy, or difficult to awaken.  You are weak, dizzy, or extremely thirsty. SEEK IMMEDIATE MEDICAL CARE IF:   You are unable to keep fluids down or you get worse despite treatment.  You have frequent episodes of vomiting or diarrhea.  You have blood or green matter (bile) in your vomit.  You have blood in your stool or your stool looks black and tarry.  You have not urinated in 6 to 8 hours, or you have only urinated a small amount of very dark urine.  You have a fever.  You faint. MAKE SURE YOU:   Understand these instructions.  Will watch your condition.  Will get help right away if you are not doing well or get worse. Document Released: 08/04/2005 Document Revised: 10/27/2011 Document Reviewed: 03/24/2011 Exit[employer] Patient Information 2014 [employer].  

## 2013-11-04 ENCOUNTER — Ambulatory Visit (INDEPENDENT_AMBULATORY_CARE_PROVIDER_SITE_OTHER): Payer: BC Managed Care – PPO | Admitting: General Surgery

## 2013-11-04 ENCOUNTER — Encounter (INDEPENDENT_AMBULATORY_CARE_PROVIDER_SITE_OTHER): Payer: Self-pay | Admitting: General Surgery

## 2013-11-04 ENCOUNTER — Other Ambulatory Visit (INDEPENDENT_AMBULATORY_CARE_PROVIDER_SITE_OTHER): Payer: Self-pay | Admitting: General Surgery

## 2013-11-04 VITALS — BP 122/80 | HR 75 | Temp 98.2°F | Resp 14 | Ht 63.0 in | Wt 104.0 lb

## 2013-11-04 DIAGNOSIS — C169 Malignant neoplasm of stomach, unspecified: Secondary | ICD-10-CM

## 2013-11-04 DIAGNOSIS — B372 Candidiasis of skin and nail: Secondary | ICD-10-CM | POA: Insufficient documentation

## 2013-11-04 MED ORDER — FENTANYL 25 MCG/HR TD PT72: 25.0000 ug | MEDICATED_PATCH | TRANSDERMAL | Status: DC

## 2013-11-04 MED ORDER — BACLOFEN 10 MG PO TABS: 10.0000 mg | ORAL_TABLET | Freq: Two times a day (BID) | ORAL | Status: DC | PRN

## 2013-11-04 MED ORDER — NYSTATIN-TRIAMCINOLONE 100000-0.1 UNIT/GM-% EX OINT
1.0000 | TOPICAL_OINTMENT | Freq: Two times a day (BID) | CUTANEOUS | Status: DC
Start: 2013-11-04 — End: 2013-12-12

## 2013-11-04 NOTE — Assessment & Plan Note (Signed)
Will add nystatin ointment around tube.

## 2013-11-04 NOTE — Patient Instructions (Signed)
Place nystatin cream around J tube.   Add fentanyl patch for pain.  Try muscle relaxant.    Will refer to physical therapy for mobility.

## 2013-11-04 NOTE — Assessment & Plan Note (Signed)
Continue chemo  Full feeds via j tube.

## 2013-11-04 NOTE — Progress Notes (Signed)
HISTORY: Pt had J tube complaints with redness and leaking around tube.  He was started on bactrim and the redness is better.  He is having less leakage today.  He denies fever/ chills.  He is also having complaints of having issues standing up straight.  He is unable to eat pretty much anything.      EXAM: General:  Alert and oriented.   Incision:  Healing well.  Skin sensitive.  j tube with around 5-8 mm redness right around tube.  No leakage seen.     PATHOLOGY:    ASSESSMENT AND PLAN:   Candidiasis around j tube. Will add nystatin ointment around tube.    Gastric cancer, Stage 4, metastatic Continue chemo  Full feeds via j tube.        Milus Height, MD Surgical Oncology, [facility]  Bartholome Bill, MD Bartholome Bill, MD

## 2013-11-09 ENCOUNTER — Telehealth (INDEPENDENT_AMBULATORY_CARE_PROVIDER_SITE_OTHER): Payer: Self-pay | Admitting: *Deleted

## 2013-11-09 NOTE — Telephone Encounter (Signed)
Per Alver Fisher with Physical Therapy, the patient has declined services.  Will notify Dr. Barry Dienes.

## 2013-11-10 ENCOUNTER — Other Ambulatory Visit: Payer: BC Managed Care – PPO

## 2013-11-10 ENCOUNTER — Ambulatory Visit: Payer: BC Managed Care – PPO

## 2013-11-10 ENCOUNTER — Ambulatory Visit: Payer: BC Managed Care – PPO | Admitting: Oncology

## 2013-11-16 ENCOUNTER — Other Ambulatory Visit: Payer: Self-pay

## 2013-11-17 ENCOUNTER — Other Ambulatory Visit: Payer: Self-pay | Admitting: Nurse Practitioner

## 2013-11-17 ENCOUNTER — Encounter: Payer: BC Managed Care – PPO | Admitting: Nutrition

## 2013-11-17 ENCOUNTER — Ambulatory Visit (HOSPITAL_BASED_OUTPATIENT_CLINIC_OR_DEPARTMENT_OTHER): Payer: BC Managed Care – PPO

## 2013-11-17 ENCOUNTER — Telehealth: Payer: Self-pay | Admitting: Oncology

## 2013-11-17 ENCOUNTER — Ambulatory Visit (HOSPITAL_BASED_OUTPATIENT_CLINIC_OR_DEPARTMENT_OTHER): Payer: BC Managed Care – PPO | Admitting: Nurse Practitioner

## 2013-11-17 ENCOUNTER — Other Ambulatory Visit (HOSPITAL_BASED_OUTPATIENT_CLINIC_OR_DEPARTMENT_OTHER): Payer: BC Managed Care – PPO

## 2013-11-17 VITALS — BP 119/76 | HR 89 | Temp 97.9°F | Resp 18 | Ht 63.0 in | Wt 103.4 lb

## 2013-11-17 DIAGNOSIS — C165 Malignant neoplasm of lesser curvature of stomach, unspecified: Secondary | ICD-10-CM

## 2013-11-17 DIAGNOSIS — C772 Secondary and unspecified malignant neoplasm of intra-abdominal lymph nodes: Secondary | ICD-10-CM

## 2013-11-17 DIAGNOSIS — Z5111 Encounter for antineoplastic chemotherapy: Secondary | ICD-10-CM

## 2013-11-17 DIAGNOSIS — C169 Malignant neoplasm of stomach, unspecified: Secondary | ICD-10-CM

## 2013-11-17 DIAGNOSIS — Z5112 Encounter for antineoplastic immunotherapy: Secondary | ICD-10-CM

## 2013-11-17 DIAGNOSIS — G893 Neoplasm related pain (acute) (chronic): Secondary | ICD-10-CM

## 2013-11-17 LAB — COMPREHENSIVE METABOLIC PANEL (CC13)
ALK PHOS: 66 U/L (ref 40–150)
ALT: 29 U/L (ref 0–55)
ANION GAP: 10 meq/L (ref 3–11)
AST: 27 U/L (ref 5–34)
Albumin: 3.3 g/dL — ABNORMAL LOW (ref 3.5–5.0)
BILIRUBIN TOTAL: 0.2 mg/dL (ref 0.20–1.20)
BUN: 13.3 mg/dL (ref 7.0–26.0)
CO2: 25 meq/L (ref 22–29)
Calcium: 8.9 mg/dL (ref 8.4–10.4)
Chloride: 99 mEq/L (ref 98–109)
Creatinine: 0.7 mg/dL (ref 0.7–1.3)
GLUCOSE: 98 mg/dL (ref 70–140)
Potassium: 4.5 mEq/L (ref 3.5–5.1)
Sodium: 134 mEq/L — ABNORMAL LOW (ref 136–145)
Total Protein: 7.2 g/dL (ref 6.4–8.3)

## 2013-11-17 LAB — CBC WITH DIFFERENTIAL/PLATELET
BASO%: 0.5 % (ref 0.0–2.0)
Basophils Absolute: 0.1 10*3/uL (ref 0.0–0.1)
EOS%: 3.3 % (ref 0.0–7.0)
Eosinophils Absolute: 0.4 10*3/uL (ref 0.0–0.5)
HEMATOCRIT: 38.4 % (ref 38.4–49.9)
HGB: 13 g/dL (ref 13.0–17.1)
LYMPH%: 31.5 % (ref 14.0–49.0)
MCH: 33.5 pg — ABNORMAL HIGH (ref 27.2–33.4)
MCHC: 33.9 g/dL (ref 32.0–36.0)
MCV: 99 fL — AB (ref 79.3–98.0)
MONO#: 1.7 10*3/uL — ABNORMAL HIGH (ref 0.1–0.9)
MONO%: 15.3 % — AB (ref 0.0–14.0)
NEUT#: 5.3 10*3/uL (ref 1.5–6.5)
NEUT%: 49.4 % (ref 39.0–75.0)
PLATELETS: 476 10*3/uL — AB (ref 140–400)
RBC: 3.88 10*6/uL — AB (ref 4.20–5.82)
RDW: 15.4 % — ABNORMAL HIGH (ref 11.0–14.6)
WBC: 10.8 10*3/uL — AB (ref 4.0–10.3)
lymph#: 3.4 10*3/uL — ABNORMAL HIGH (ref 0.9–3.3)

## 2013-11-17 MED ORDER — SODIUM CHLORIDE 0.9 % IV SOLN
2400.0000 mg/m2 | INTRAVENOUS | Status: DC
  Administered 2013-11-17: 3300 mg via INTRAVENOUS
  Filled 2013-11-17: qty 66

## 2013-11-17 MED ORDER — OXYCODONE HCL 5 MG/5ML PO SOLN: 10.0000 mg | Freq: Four times a day (QID) | ORAL | Status: DC | PRN

## 2013-11-17 MED ORDER — DIPHENHYDRAMINE HCL 25 MG PO CAPS: 50.0000 mg | ORAL_CAPSULE | Freq: Once | ORAL | Status: DC

## 2013-11-17 MED ORDER — SODIUM CHLORIDE 0.9 % IV SOLN
150.0000 mg | Freq: Once | INTRAVENOUS | Status: AC
  Administered 2013-11-17: 150 mg via INTRAVENOUS
  Filled 2013-11-17: qty 5

## 2013-11-17 MED ORDER — SODIUM CHLORIDE 0.9 % IV SOLN
Freq: Once | INTRAVENOUS | Status: AC
  Administered 2013-11-17: 12:00:00 via INTRAVENOUS

## 2013-11-17 MED ORDER — OXALIPLATIN CHEMO INJECTION 100 MG/20ML
85.0000 mg/m2 | Freq: Once | INTRAVENOUS | Status: AC
  Administered 2013-11-17: 115 mg via INTRAVENOUS
  Filled 2013-11-17: qty 23

## 2013-11-17 MED ORDER — SODIUM CHLORIDE 0.9 % IV SOLN
4.0000 mg/kg | Freq: Once | INTRAVENOUS | Status: AC
  Administered 2013-11-17: 189 mg via INTRAVENOUS
  Filled 2013-11-17: qty 9

## 2013-11-17 MED ORDER — FLUOROURACIL CHEMO INJECTION 2.5 GM/50ML
400.0000 mg/m2 | Freq: Once | INTRAVENOUS | Status: AC
  Administered 2013-11-17: 550 mg via INTRAVENOUS
  Filled 2013-11-17: qty 11

## 2013-11-17 MED ORDER — ACETAMINOPHEN 325 MG PO TABS: 650.0000 mg | ORAL_TABLET | Freq: Once | ORAL | Status: DC

## 2013-11-17 MED ORDER — ACETAMINOPHEN 160 MG/5ML PO SOLN
650.0000 mg | Freq: Once | ORAL | Status: AC
  Administered 2013-11-17: 650 mg via ORAL
  Filled 2013-11-17: qty 20.3

## 2013-11-17 MED ORDER — LEUCOVORIN CALCIUM INJECTION 350 MG
550.0000 mg | Freq: Once | INTRAVENOUS | Status: AC
  Administered 2013-11-17: 550 mg via INTRAVENOUS
  Filled 2013-11-17: qty 27.5

## 2013-11-17 MED ORDER — DEXAMETHASONE SODIUM PHOSPHATE 10 MG/ML IJ SOLN
10.0000 mg | Freq: Once | INTRAMUSCULAR | Status: AC
  Administered 2013-11-17: 10 mg via INTRAVENOUS

## 2013-11-17 MED ORDER — DEXTROSE 5 % IV SOLN
Freq: Once | INTRAVENOUS | Status: AC
  Administered 2013-11-17: 15:00:00 via INTRAVENOUS

## 2013-11-17 MED ORDER — PALONOSETRON HCL INJECTION 0.25 MG/5ML
0.2500 mg | Freq: Once | INTRAVENOUS | Status: AC
  Administered 2013-11-17: 0.25 mg via INTRAVENOUS

## 2013-11-17 MED ORDER — DIPHENHYDRAMINE HCL 50 MG/ML IJ SOLN
50.0000 mg | Freq: Once | INTRAMUSCULAR | Status: AC
  Administered 2013-11-17: 50 mg via INTRAVENOUS

## 2013-11-17 MED ORDER — PALONOSETRON HCL INJECTION 0.25 MG/5ML
INTRAVENOUS | Status: AC
  Filled 2013-11-17: qty 5

## 2013-11-17 MED ORDER — DIPHENHYDRAMINE HCL 50 MG/ML IJ SOLN
INTRAMUSCULAR | Status: AC
  Filled 2013-11-17: qty 1

## 2013-11-17 MED ORDER — DEXAMETHASONE SODIUM PHOSPHATE 10 MG/ML IJ SOLN
INTRAMUSCULAR | Status: AC
  Filled 2013-11-17: qty 1

## 2013-11-17 NOTE — Progress Notes (Signed)
  [place] OFFICE PROGRESS NOTE   Diagnosis:  Metastatic gastric cancer.  INTERVAL HISTORY:   Evan Peterson returns as scheduled. He completed cycle 2 FOLFOX/cycle 1 Herceptin on 10/12/2013. Cycle 3 was held on 10/27/2013 due to neutropenia. Cycle 3 was held again on 11/03/2013 per patient request. He was noted to have an infection around the feeding tube on 11/03/2013 and started on a course of Septra.  He notes improvement in the redness and pain around the feeding tube. He continues to have intermittent nausea. He typically notes the nausea after eating something "sweet". Bowels moving with the aid of a laxative. He continues to have intermittent abdominal pain. He takes pain medication 3-4 times a day. He denies numbness or tingling in his hands or feet.  Objective:  Vital signs in last 24 hours:  Blood pressure 119/76, pulse 89, temperature 97.9 F (36.6 C), temperature source Oral, resp. rate 18, height $RemoveBe'5\' 3"'drOsHNcpb$  (1.6 m), weight 103 lb 6.4 oz (46.902 kg), SpO2 98.00%.    HEENT: No thrush or ulcerations. Resp: Lungs clear. Cardio: Regular cardiac rhythm. GI: Generalized abdominal tenderness. Jejunostomy tube site is without erythema. Vascular: No leg edema. Neuro: Vibratory sense mildly decreased over the fingertips per tuning fork exam.  Portacath/PICC-without erythema.  Lab Results:  Lab Results  Component Value Date   WBC 10.8* 11/17/2013   HGB 13.0 11/17/2013   HCT 38.4 11/17/2013   MCV 99.0* 11/17/2013   PLT 476* 11/17/2013   NEUTROABS 5.3 11/17/2013    Lab Results  Component Value Date   CEA 1.5 09/21/2013    Imaging:  No results found.  Medications: I have reviewed the patient's current medications.  Assessment/Plan: 1. Metastatic gastric cancer, stage IV. Status post diagnostic laparoscopy 08/05/2013 confirming carcinomatosis with biopsies of a transverse colon lymph node and small bowel mesentery nodule confirming metastatic poorly differentiated  adenocarcinoma.  Lesser curvature gastric mass confirmed on endoscopy 07/12/2013 with a biopsy revealing poorly differentiated adenocarcinoma. HER-2 positive.  Cycle 1 FOLFOX 09/21/2013.  Cycle 2 FOLFOX/initiation of Herceptin 10/12/2013. Treatment held on 10/27/2013 due to neutropenia. Treatment held on 11/03/2013 per patient request. 2. Anorexia/weight loss and malnutrition. Maintained on jejunostomy tube feedings. Weight is stable. 3. Pain secondary to gastric cancer. Currently taking liquid oxycodone ($RemoveBeforeDEI'5mg'OpaSVzbjkWSQGuDQ$ /61ml) 10 ml every 6 hours as needed.  4. Intermittent nausea and vomiting secondary to gastric cancer. He takes Zofran as needed.  5. Port-A-Cath placement by Dr. Barry Dienes 09/14/2013. 6. 2-D echo 10/17/2013. Cavity size was normal. Systolic function was normal. Wall motion normal. No regional wall motion abnormalities. 7. Jejunostomy tube site infection 11/03/2013. He completed a course of Septra. The erythema and pain around the tube site have resolved.   Disposition: He appears stable. Plan to proceed with cycle 3 FOLFOX/cycle 2 Herceptin today as scheduled. He will return for a followup visit and treatment in 2 weeks. He will contact the office in the interim with any problems.  Plan reviewed with Dr. Benay Spice.    Ned Card ANP/GNP-BC   11/17/2013  11:31 AM

## 2013-11-17 NOTE — Telephone Encounter (Signed)
gv pt appt schedule for april via interpreter

## 2013-11-17 NOTE — Progress Notes (Signed)
Gave patient his acetaminophen premed via J-tube with water flush before and afterwards. Patient reports he can't swallow pills.

## 2013-11-17 NOTE — Patient Instructions (Signed)
[place] Discharge Instructions for Patients Receiving Chemotherapy  Today you received the following chemotherapy agents : Herceptin, Oxaliplatin, Leucovorin and 5 FU  To help prevent nausea and vomiting after your treatment, we encourage you to take your nausea medications as needed:  Ativan 0.5 mg every 8 hours as needed (dissolve under tongue)  Zofran 4 mg tablet (dissolve on tongue)or use liquid form 5 ml  every 8 hours in J-tube as needed--Do start until 11/19/13 If you develop nausea and vomiting that is not controlled by your nausea medication, call the clinic.   BELOW ARE SYMPTOMS THAT SHOULD BE REPORTED IMMEDIATELY:  *FEVER GREATER THAN 100.5 F  *CHILLS WITH OR WITHOUT FEVER  NAUSEA AND VOMITING THAT IS NOT CONTROLLED WITH YOUR NAUSEA MEDICATION  *UNUSUAL SHORTNESS OF BREATH  *UNUSUAL BRUISING OR BLEEDING  TENDERNESS IN MOUTH AND THROAT WITH OR WITHOUT PRESENCE OF ULCERS  *URINARY PROBLEMS  *BOWEL PROBLEMS  UNUSUAL RASH Items with * indicate a potential emergency and should be followed up as soon as possible.  Feel free to call the clinic should you have any questions or concerns. The clinic phone number is [phone].  It has been a pleasure to serve you today!

## 2013-11-19 ENCOUNTER — Ambulatory Visit (HOSPITAL_BASED_OUTPATIENT_CLINIC_OR_DEPARTMENT_OTHER): Payer: BC Managed Care – PPO

## 2013-11-19 VITALS — BP 112/65 | HR 77 | Temp 98.1°F | Resp 16

## 2013-11-19 DIAGNOSIS — C169 Malignant neoplasm of stomach, unspecified: Secondary | ICD-10-CM

## 2013-11-19 DIAGNOSIS — D709 Neutropenia, unspecified: Secondary | ICD-10-CM

## 2013-11-19 MED ORDER — SODIUM CHLORIDE 0.9 % IJ SOLN
10.0000 mL | INTRAMUSCULAR | Status: DC | PRN
  Administered 2013-11-19: 10 mL
  Filled 2013-11-19: qty 10

## 2013-11-19 MED ORDER — HEPARIN SOD (PORK) LOCK FLUSH 100 UNIT/ML IV SOLN
500.0000 [IU] | Freq: Once | INTRAVENOUS | Status: AC | PRN
  Administered 2013-11-19: 500 [IU]
  Filled 2013-11-19: qty 5

## 2013-11-19 MED ORDER — PEGFILGRASTIM INJECTION 6 MG/0.6ML
6.0000 mg | Freq: Once | SUBCUTANEOUS | Status: AC
  Administered 2013-11-19: 6 mg via SUBCUTANEOUS

## 2013-11-21 ENCOUNTER — Telehealth: Payer: Self-pay | Admitting: *Deleted

## 2013-11-21 NOTE — Telephone Encounter (Signed)
Left VM asking patient to call him back. Did not note his complaint.

## 2013-11-21 NOTE — Telephone Encounter (Signed)
Called back and he reports he is having shaking chills and sweats. Asking "what should I do?". Inquired if he had a fever and he said no, but could not tell nurse what the thermometer reading was. Says his PAC site looks OK. Instructed him to go to emergency room now for evaluation-he is high risk for infection.

## 2013-11-27 ENCOUNTER — Other Ambulatory Visit: Payer: Self-pay | Admitting: Oncology

## 2013-11-28 ENCOUNTER — Other Ambulatory Visit: Payer: Self-pay | Admitting: *Deleted

## 2013-11-28 DIAGNOSIS — C169 Malignant neoplasm of stomach, unspecified: Secondary | ICD-10-CM

## 2013-11-28 MED ORDER — OXYCODONE HCL 5 MG/5ML PO SOLN: 10.0000 mg | Freq: Four times a day (QID) | ORAL | Status: DC | PRN

## 2013-11-28 NOTE — Telephone Encounter (Signed)
Would like to pick up refill script on his liquid pain medication tomorrow. Continues to need it every 6 hours ATC.

## 2013-12-01 ENCOUNTER — Other Ambulatory Visit: Payer: Self-pay | Admitting: *Deleted

## 2013-12-01 ENCOUNTER — Ambulatory Visit: Payer: BC Managed Care – PPO

## 2013-12-01 ENCOUNTER — Telehealth: Payer: Self-pay | Admitting: Oncology

## 2013-12-01 ENCOUNTER — Ambulatory Visit (HOSPITAL_BASED_OUTPATIENT_CLINIC_OR_DEPARTMENT_OTHER): Payer: BC Managed Care – PPO

## 2013-12-01 ENCOUNTER — Ambulatory Visit (HOSPITAL_BASED_OUTPATIENT_CLINIC_OR_DEPARTMENT_OTHER): Payer: BC Managed Care – PPO | Admitting: Nurse Practitioner

## 2013-12-01 VITALS — BP 108/73 | HR 81 | Temp 97.2°F | Resp 18 | Ht 63.0 in | Wt 103.6 lb

## 2013-12-01 DIAGNOSIS — C165 Malignant neoplasm of lesser curvature of stomach, unspecified: Secondary | ICD-10-CM

## 2013-12-01 DIAGNOSIS — Z95828 Presence of other vascular implants and grafts: Secondary | ICD-10-CM

## 2013-12-01 DIAGNOSIS — C169 Malignant neoplasm of stomach, unspecified: Secondary | ICD-10-CM

## 2013-12-01 DIAGNOSIS — G893 Neoplasm related pain (acute) (chronic): Secondary | ICD-10-CM

## 2013-12-01 DIAGNOSIS — C772 Secondary and unspecified malignant neoplasm of intra-abdominal lymph nodes: Secondary | ICD-10-CM

## 2013-12-01 DIAGNOSIS — R112 Nausea with vomiting, unspecified: Secondary | ICD-10-CM

## 2013-12-01 DIAGNOSIS — K9419 Other complications of enterostomy: Secondary | ICD-10-CM

## 2013-12-01 LAB — COMPREHENSIVE METABOLIC PANEL (CC13)
ALBUMIN: 3.1 g/dL — AB (ref 3.5–5.0)
ALT: 34 U/L (ref 0–55)
ANION GAP: 10 meq/L (ref 3–11)
AST: 29 U/L (ref 5–34)
Alkaline Phosphatase: 112 U/L (ref 40–150)
BUN: 9.2 mg/dL (ref 7.0–26.0)
CHLORIDE: 100 meq/L (ref 98–109)
CO2: 28 meq/L (ref 22–29)
Calcium: 9 mg/dL (ref 8.4–10.4)
Creatinine: 0.7 mg/dL (ref 0.7–1.3)
Glucose: 100 mg/dl (ref 70–140)
POTASSIUM: 4.4 meq/L (ref 3.5–5.1)
SODIUM: 137 meq/L (ref 136–145)
TOTAL PROTEIN: 6.6 g/dL (ref 6.4–8.3)
Total Bilirubin: <0.2 mg/dL (ref 0.20–1.20)

## 2013-12-01 LAB — CBC WITH DIFFERENTIAL/PLATELET
BASO%: 0.4 % (ref 0.0–2.0)
Basophils Absolute: 0.1 10*3/uL (ref 0.0–0.1)
EOS%: 0.8 % (ref 0.0–7.0)
Eosinophils Absolute: 0.2 10*3/uL (ref 0.0–0.5)
HCT: 37.3 % — ABNORMAL LOW (ref 38.4–49.9)
HGB: 12.4 g/dL — ABNORMAL LOW (ref 13.0–17.1)
LYMPH#: 4.8 10*3/uL — AB (ref 0.9–3.3)
LYMPH%: 16.5 % (ref 14.0–49.0)
MCH: 33.7 pg — ABNORMAL HIGH (ref 27.2–33.4)
MCHC: 33.1 g/dL (ref 32.0–36.0)
MCV: 101.9 fL — ABNORMAL HIGH (ref 79.3–98.0)
MONO#: 2.9 10*3/uL — AB (ref 0.1–0.9)
MONO%: 10.1 % (ref 0.0–14.0)
NEUT%: 72.2 % (ref 39.0–75.0)
NEUTROS ABS: 21 10*3/uL — AB (ref 1.5–6.5)
Platelets: 378 10*3/uL (ref 140–400)
RBC: 3.66 10*6/uL — AB (ref 4.20–5.82)
RDW: 16.1 % — AB (ref 11.0–14.6)
WBC: 29 10*3/uL — AB (ref 4.0–10.3)

## 2013-12-01 MED ORDER — DOXYCYCLINE CALCIUM 50 MG/5ML PO SYRP: 100.0000 mg | ORAL_SOLUTION | Freq: Two times a day (BID) | ORAL | Status: DC

## 2013-12-01 MED ORDER — SODIUM CHLORIDE 0.9 % IJ SOLN
10.0000 mL | INTRAMUSCULAR | Status: DC | PRN
Start: 2013-12-01 — End: 2017-01-14
  Administered 2013-12-01: 10 mL via INTRAVENOUS
  Filled 2013-12-01: qty 10

## 2013-12-01 NOTE — Patient Instructions (Signed)

## 2013-12-01 NOTE — Telephone Encounter (Signed)
called pt re appt for flush 4/17 per 4/16 pof. called via [place] interpreters and was not able to reach pt or wife or leave mesage at either number. lmonvm informing desk nurse.

## 2013-12-01 NOTE — Progress Notes (Signed)
Call from pt reporting he needs port needle removed. Chemo was held today, pt left with port accessed. Instructed pt to come in 4/17 at 1045. He voiced understanding.

## 2013-12-01 NOTE — Telephone Encounter (Signed)
gv adn printed appt sched and avs for pt fro April adn May.....sed added tx. °

## 2013-12-01 NOTE — Progress Notes (Addendum)
  [place] OFFICE PROGRESS NOTE   Diagnosis:  Metastatic gastric cancer.  INTERVAL HISTORY:   He returns as scheduled. He completed cycle 3 FOLFOX/cycle 2 Herceptin on 11/17/2013. He continues to have intermittent nausea/vomiting versus regurgitation. No diarrhea. He is intermittently constipated and takes a laxative as needed. No mouth sores. He denies numbness or tingling in his hands or feet. He feels "cold". Abdominal pain is unchanged. He estimates taking pain medication 4 times a day.  Objective:  Vital signs in last 24 hours:  Blood pressure 108/73, pulse 81, temperature 97.2 F (36.2 C), temperature source Oral, resp. rate 18, height $RemoveBe'5\' 3"'kPKkBAHSf$  (1.6 m), weight 103 lb 9.6 oz (46.993 kg).    HEENT: No thrush or ulcerations. Resp: Lungs clear. Cardio: Regular cardiac rhythm. GI: Abdomen with diffuse mild tenderness. J-tube site with erythema primarily extending medially, mild induration. Vascular: No leg edema. Neuro: Vibratory sense mildly decreased over the fingertips per tuning fork exam.   Port-A-Cath site is without erythema.   Lab Results:  Lab Results  Component Value Date   WBC 29.0* 12/01/2013   HGB 12.4* 12/01/2013   HCT 37.3* 12/01/2013   MCV 101.9* 12/01/2013   PLT 378 12/01/2013   NEUTROABS 21.0* 12/01/2013    Imaging:  No results found.  Medications: I have reviewed the patient's current medications.  Assessment/Plan: 1. Metastatic gastric cancer, stage IV. Status post diagnostic laparoscopy 08/05/2013 confirming carcinomatosis with biopsies of a transverse colon lymph node and small bowel mesentery nodule confirming metastatic poorly differentiated adenocarcinoma.  Lesser curvature gastric mass confirmed on endoscopy 07/12/2013 with a biopsy revealing poorly differentiated adenocarcinoma. HER-2 positive.  Cycle 1 FOLFOX 09/21/2013.  Cycle 2 FOLFOX/initiation of Herceptin 10/12/2013.  Treatment held on 10/27/2013 due to neutropenia.    Treatment held on 11/03/2013 per patient request. Cycle 3 FOLFOX/cycle 2 Herceptin 11/17/2013. 2. Anorexia/weight loss and malnutrition. Maintained on jejunostomy tube feedings. Weight is stable. 3. Pain secondary to gastric cancer. Currently taking liquid oxycodone ($RemoveBeforeDEI'5mg'LWPZgOUlekPiGfGx$ /35ml) 10 ml every 6 hours as needed.  4. Intermittent nausea and vomiting secondary to gastric cancer. He takes Zofran as needed.  5. Port-A-Cath placement by Dr. Barry Dienes 09/14/2013. 6. 2-D echo 10/17/2013. Cavity size was normal. Systolic function was normal. Wall motion normal. No regional wall motion abnormalities. 7. Jejunostomy tube site infection 11/03/2013. He completed a course of Septra. The erythema and pain around the tube site resolved. He has recurrent erythema at the J-tube site today. He will complete a course of doxycycline.   Disposition: He appears stable. He has completed 3 cycles of FOLFOX, 2 cycles of Herceptin. We recommended proceeding with treatment today as scheduled. He declines treatment today and requests we reschedule for one week. He will return for treatment on 12/08/2013. We will see him in followup on 12/22/2013. He will contact the office in the interim with any problems. We specifically discussed fever, chills.   Patient seen with Dr. Benay Spice.    Owens Shark ANP/GNP-BC   12/01/2013  9:59 AM This was a shared visit with Ned Card. He may have another infection at the jejunostomy tube site.  He declined chemotherapy today. He will be scheduled for a fourth cycle of FOLFOX in one week.he will be referred for a restaging CT of the abdomen after 5 cycles of systemic therapy.  Julieanne Manson, M.D.

## 2013-12-02 ENCOUNTER — Ambulatory Visit (HOSPITAL_BASED_OUTPATIENT_CLINIC_OR_DEPARTMENT_OTHER): Payer: BC Managed Care – PPO

## 2013-12-02 DIAGNOSIS — Z95828 Presence of other vascular implants and grafts: Secondary | ICD-10-CM

## 2013-12-02 DIAGNOSIS — C165 Malignant neoplasm of lesser curvature of stomach, unspecified: Secondary | ICD-10-CM

## 2013-12-02 DIAGNOSIS — Z452 Encounter for adjustment and management of vascular access device: Secondary | ICD-10-CM

## 2013-12-02 MED ORDER — HEPARIN SOD (PORK) LOCK FLUSH 100 UNIT/ML IV SOLN
500.0000 [IU] | Freq: Once | INTRAVENOUS | Status: AC
  Administered 2013-12-02: 500 [IU] via INTRAVENOUS
  Filled 2013-12-02: qty 5

## 2013-12-02 MED ORDER — SODIUM CHLORIDE 0.9 % IJ SOLN
10.0000 mL | INTRAMUSCULAR | Status: DC | PRN
  Administered 2013-12-02: 10 mL via INTRAVENOUS
  Filled 2013-12-02: qty 10

## 2013-12-02 NOTE — Progress Notes (Signed)
Pt came in for flush. Already accessed. States that chemo was canceled yesterday and he forgot to get deaccessed.  Pt reaccessed and PAC flushed with heparin and NS.

## 2013-12-07 ENCOUNTER — Other Ambulatory Visit: Payer: Self-pay | Admitting: *Deleted

## 2013-12-07 DIAGNOSIS — C169 Malignant neoplasm of stomach, unspecified: Secondary | ICD-10-CM

## 2013-12-08 ENCOUNTER — Ambulatory Visit (HOSPITAL_BASED_OUTPATIENT_CLINIC_OR_DEPARTMENT_OTHER): Payer: BC Managed Care – PPO

## 2013-12-08 ENCOUNTER — Encounter (INDEPENDENT_AMBULATORY_CARE_PROVIDER_SITE_OTHER): Payer: Self-pay | Admitting: General Surgery

## 2013-12-08 ENCOUNTER — Other Ambulatory Visit: Payer: Self-pay | Admitting: Nurse Practitioner

## 2013-12-08 ENCOUNTER — Other Ambulatory Visit: Payer: Self-pay | Admitting: *Deleted

## 2013-12-08 ENCOUNTER — Telehealth: Payer: Self-pay | Admitting: *Deleted

## 2013-12-08 ENCOUNTER — Ambulatory Visit (INDEPENDENT_AMBULATORY_CARE_PROVIDER_SITE_OTHER): Payer: BC Managed Care – PPO | Admitting: General Surgery

## 2013-12-08 VITALS — BP 121/77 | HR 96 | Temp 97.0°F | Resp 18

## 2013-12-08 VITALS — BP 106/76 | HR 80 | Temp 97.2°F | Resp 16 | Ht 63.0 in | Wt 103.8 lb

## 2013-12-08 DIAGNOSIS — Z452 Encounter for adjustment and management of vascular access device: Secondary | ICD-10-CM

## 2013-12-08 DIAGNOSIS — Z934 Other artificial openings of gastrointestinal tract status: Secondary | ICD-10-CM

## 2013-12-08 DIAGNOSIS — C165 Malignant neoplasm of lesser curvature of stomach, unspecified: Secondary | ICD-10-CM

## 2013-12-08 DIAGNOSIS — C169 Malignant neoplasm of stomach, unspecified: Secondary | ICD-10-CM

## 2013-12-08 LAB — COMPREHENSIVE METABOLIC PANEL (CC13)
ALBUMIN: 3.1 g/dL — AB (ref 3.5–5.0)
ALT: 32 U/L (ref 0–55)
ANION GAP: 11 meq/L (ref 3–11)
AST: 30 U/L (ref 5–34)
Alkaline Phosphatase: 87 U/L (ref 40–150)
BUN: 11.2 mg/dL (ref 7.0–26.0)
CALCIUM: 9.4 mg/dL (ref 8.4–10.4)
CHLORIDE: 99 meq/L (ref 98–109)
CO2: 27 meq/L (ref 22–29)
CREATININE: 0.7 mg/dL (ref 0.7–1.3)
GLUCOSE: 107 mg/dL (ref 70–140)
POTASSIUM: 4.2 meq/L (ref 3.5–5.1)
Sodium: 137 mEq/L (ref 136–145)
Total Bilirubin: <0.2 mg/dL (ref 0.20–1.20)
Total Protein: 7.3 g/dL (ref 6.4–8.3)

## 2013-12-08 LAB — CBC WITH DIFFERENTIAL/PLATELET
BASO%: 0.5 % (ref 0.0–2.0)
Basophils Absolute: 0.1 10*3/uL (ref 0.0–0.1)
EOS ABS: 0.5 10*3/uL (ref 0.0–0.5)
EOS%: 2.3 % (ref 0.0–7.0)
HEMATOCRIT: 37.6 % — AB (ref 38.4–49.9)
HEMOGLOBIN: 12.8 g/dL — AB (ref 13.0–17.1)
LYMPH#: 3.6 10*3/uL — AB (ref 0.9–3.3)
LYMPH%: 16.7 % (ref 14.0–49.0)
MCH: 34.2 pg — ABNORMAL HIGH (ref 27.2–33.4)
MCHC: 33.9 g/dL (ref 32.0–36.0)
MCV: 100.9 fL — ABNORMAL HIGH (ref 79.3–98.0)
MONO#: 3.1 10*3/uL — AB (ref 0.1–0.9)
MONO%: 14.4 % — ABNORMAL HIGH (ref 0.0–14.0)
NEUT#: 14.4 10*3/uL — ABNORMAL HIGH (ref 1.5–6.5)
NEUT%: 66.1 % (ref 39.0–75.0)
Platelets: 492 10*3/uL — ABNORMAL HIGH (ref 140–400)
RBC: 3.73 10*6/uL — ABNORMAL LOW (ref 4.20–5.82)
RDW: 16 % — AB (ref 11.0–14.6)
WBC: 21.7 10*3/uL — ABNORMAL HIGH (ref 4.0–10.3)

## 2013-12-08 MED ORDER — SULFAMETHOXAZOLE-TRIMETHOPRIM 200-40 MG/5ML PO SUSP: 20.0000 mL | Freq: Two times a day (BID) | ORAL | Status: DC

## 2013-12-08 MED ORDER — OXYCODONE HCL 5 MG/5ML PO SOLN: 10.0000 mg | Freq: Four times a day (QID) | ORAL | Status: DC | PRN

## 2013-12-08 MED ORDER — HEPARIN SOD (PORK) LOCK FLUSH 100 UNIT/ML IV SOLN
500.0000 [IU] | Freq: Once | INTRAVENOUS | Status: AC | PRN
  Administered 2013-12-08: 500 [IU]
  Filled 2013-12-08: qty 5

## 2013-12-08 MED ORDER — SODIUM CHLORIDE 0.9 % IV SOLN: Freq: Once | INTRAVENOUS | Status: DC

## 2013-12-08 MED ORDER — SODIUM CHLORIDE 0.9 % IV SOLN: 2400.0000 mg/m2 | INTRAVENOUS | Status: DC

## 2013-12-08 MED ORDER — FLUOROURACIL CHEMO INJECTION 2.5 GM/50ML: 400.0000 mg/m2 | Freq: Once | INTRAVENOUS | Status: DC

## 2013-12-08 MED ORDER — PALONOSETRON HCL INJECTION 0.25 MG/5ML: 0.2500 mg | Freq: Once | INTRAVENOUS | Status: DC

## 2013-12-08 MED ORDER — LEUCOVORIN CALCIUM INJECTION 350 MG: 550.0000 mg | Freq: Once | INTRAVENOUS | Status: DC

## 2013-12-08 MED ORDER — DEXTROSE 5 % IV SOLN: Freq: Once | INTRAVENOUS | Status: DC

## 2013-12-08 MED ORDER — OXALIPLATIN CHEMO INJECTION 100 MG/20ML: 85.0000 mg/m2 | Freq: Once | INTRAVENOUS | Status: DC

## 2013-12-08 MED ORDER — SODIUM CHLORIDE 0.9 % IJ SOLN
10.0000 mL | INTRAMUSCULAR | Status: DC | PRN
  Administered 2013-12-08: 10 mL
  Filled 2013-12-08: qty 10

## 2013-12-08 MED ORDER — DIPHENHYDRAMINE HCL 25 MG PO CAPS: 50.0000 mg | ORAL_CAPSULE | Freq: Once | ORAL | Status: DC

## 2013-12-08 MED ORDER — DEXAMETHASONE SODIUM PHOSPHATE 10 MG/ML IJ SOLN: 10.0000 mg | Freq: Once | INTRAMUSCULAR | Status: DC

## 2013-12-08 MED ORDER — TRASTUZUMAB CHEMO INJECTION 440 MG: 4.0000 mg/kg | Freq: Once | INTRAVENOUS | Status: DC

## 2013-12-08 MED ORDER — SODIUM CHLORIDE 0.9 % IJ SOLN
10.0000 mL | INTRAMUSCULAR | Status: DC | PRN
  Administered 2013-12-08: 10 mL via INTRAVENOUS
  Filled 2013-12-08: qty 10

## 2013-12-08 MED ORDER — SODIUM CHLORIDE 0.9 % IV SOLN: 150.0000 mg | Freq: Once | INTRAVENOUS | Status: DC

## 2013-12-08 MED ORDER — ACETAMINOPHEN 325 MG PO TABS: 650.0000 mg | ORAL_TABLET | Freq: Once | ORAL | Status: DC

## 2013-12-08 NOTE — Patient Instructions (Addendum)
Wound Infection A wound infection happens when a type of germ (bacteria) starts growing in the wound. In some cases, this can cause the wound to break open. If cared for properly, the infected wound will heal from the inside to the outside. Wound infections need treatment. CAUSES An infection is caused by bacteria growing in the wound.  SYMPTOMS   Increase in redness, swelling, or pain at the wound site.  Increase in drainage at the wound site.  Wound or bandage (dressing) starts to smell bad.  Fever.  Feeling tired or fatigued.  Pus draining from the wound. TREATMENT  You caregiver will prescribe antibiotic medicine. The wound infection should improve within 24 to 48 hours. Any redness around the wound should stop spreading and the wound should be less painful.  HOME CARE INSTRUCTIONS   Only take over-the-counter or prescription medicines for pain, discomfort, or fever as directed by your caregiver.  Take your antibiotics as directed. Finish them even if you start to feel better.  Gently wash the area with mild soap and water 2 times a day, or as directed. Rinse off the soap. Pat the area dry with a clean towel. Do not rub the wound. This may cause bleeding.  Follow your caregiver's instructions for how often you need to change the dressing.  Apply ointment and a dressing to the wound as directed.  If the dressing sticks, moisten it with soapy water and gently remove it.  Change the bandage right away if it becomes wet, dirty, or develops a bad smell.  Take showers. Do not take tub baths, swim, or do anything that may soak the wound until it is healed.  Avoid exercises that make you sweat heavily.  Use anti-itch medicine as directed by your caregiver. The wound may itch when it is healing. Do not pick or scratch at the wound.  Follow up with your caregiver to get your wound rechecked as directed. SEEK MEDICAL CARE IF:  You have an increase in swelling, pain, or redness  around the wound.  You have an increase in the amount of pus coming from the wound.  There is a bad smell coming from the wound.  More of the wound breaks open.  You have a fever. MAKE SURE YOU:   Understand these instructions.  Will watch your condition.  Will get help right away if you are not doing well or get worse. Document Released: 05/03/2003 Document Revised: 10/27/2011 Document Reviewed: 12/08/2010 [facility] Patient Information 2014 [place], [place].  Patient instructed to go to [facility] today 4/23 at 2:30 to see Dr Grandville Silos for eval of J- tube. Understanding verbalized per interpreter.

## 2013-12-08 NOTE — Telephone Encounter (Signed)
Treatment canceled today per Elby Showers. Marcello Moores

## 2013-12-08 NOTE — Patient Instructions (Signed)

## 2013-12-08 NOTE — Progress Notes (Signed)
Subjective:     Patient ID: Evan Peterson, male   DOB: 1960-12-26, 53 y.o.   MRN: [id number]  HPI Patient history of gastric cancer. He is a patient of Dr. Barry Dienes. He has a J-tube. He developed redness and drainage around it and comes to the urgent clinic. No fevers.  Review of Systems     Objective:   Physical Exam Abdomen soft and nontender. G-tube site with mild cellulitis and minimal purulent drainage. It was thoroughly cleaned with peroxide. A new dressing was placed.    Assessment:     Jejunostomy tube with mild cellulitis    Plan:     Bactrim elixir twice a day. Follow up with Dr. Barry Dienes next week. They may continue to use the tube.

## 2013-12-08 NOTE — Telephone Encounter (Signed)
Printer malfunction. Narcotic Rx reprinted twice.

## 2013-12-08 NOTE — Telephone Encounter (Signed)
Patient was seen in infusion today by Elby Showers. Thomas.  Patient has swelling, redness, drainage and pain around J Tube site.  Patient also stated that tube feedings are getting more difficult.  Called Dr. Marlowe Aschoff office per Elby Showers. Thomas to see if patient could be seen at office today.  Spoke with Triage nurse, she stated that patient could be seen today 12/08/13 at 2:30 with Dr. Marcello Moores.  Informed patient and interpreter.

## 2013-12-09 ENCOUNTER — Encounter: Payer: Self-pay | Admitting: *Deleted

## 2013-12-09 NOTE — Progress Notes (Signed)
[place] Work  Clinical Social Work was referred by Therapist, sports and interpreter for assessment of psychosocial needs.  Clinical Social Worker contacted interpreter, Emogene Morgan to offer support and assess for needs.  Emogene Morgan reports pt is requesting additional supports at home due to wife providing extensive care support. Pt appears to be inquiring if wife could be paid as care provider through a program like CAPs. CSW is researching this option and will get back to pt and family.     Clinical Social Work interventions: Resource assistance  [employer] Clinical Social Worker Doris S. En[place] for [place] Wednesday, Thursday and Friday Phone: [phone] Fax: [phone]

## 2013-12-11 ENCOUNTER — Other Ambulatory Visit: Payer: Self-pay | Admitting: Oncology

## 2013-12-12 ENCOUNTER — Telehealth: Payer: Self-pay | Admitting: Oncology

## 2013-12-12 ENCOUNTER — Telehealth: Payer: Self-pay | Admitting: *Deleted

## 2013-12-12 ENCOUNTER — Ambulatory Visit (INDEPENDENT_AMBULATORY_CARE_PROVIDER_SITE_OTHER): Payer: BC Managed Care – PPO | Admitting: General Surgery

## 2013-12-12 ENCOUNTER — Other Ambulatory Visit: Payer: Self-pay | Admitting: *Deleted

## 2013-12-12 ENCOUNTER — Encounter (INDEPENDENT_AMBULATORY_CARE_PROVIDER_SITE_OTHER): Payer: Self-pay | Admitting: General Surgery

## 2013-12-12 VITALS — BP 120/70 | HR 90 | Temp 97.4°F | Resp 14 | Wt 105.6 lb

## 2013-12-12 DIAGNOSIS — B372 Candidiasis of skin and nail: Secondary | ICD-10-CM

## 2013-12-12 MED ORDER — BAZA PROTECT EX CREA: TOPICAL_CREAM | CUTANEOUS | Status: AC

## 2013-12-12 MED ORDER — FLUCONAZOLE 100 MG PO TABS: 100.0000 mg | ORAL_TABLET | Freq: Every day | ORAL | Status: AC

## 2013-12-12 NOTE — Patient Instructions (Signed)
Stop nystatin cream.  Apply zinc oxide cream to skin around g tube.  Continue antibiotics.  Add diflucan

## 2013-12-12 NOTE — Telephone Encounter (Signed)
Per staff message I have scheduled appts. Advised scheduler to move labs

## 2013-12-12 NOTE — Telephone Encounter (Signed)
TAlked to pt and gave him appt for 4/30 lab and chemo, and gave him appt for for May, also advised pt to get a new appt calendar

## 2013-12-13 ENCOUNTER — Telehealth: Payer: Self-pay | Admitting: Oncology

## 2013-12-13 NOTE — Telephone Encounter (Signed)
Talked to pt and gave him appt for lab,md and chemio

## 2013-12-13 NOTE — Progress Notes (Signed)
HISTORY: Visit is conducted with vietnamese interpreter.  Pt is around 4 months s/p j tube for metastatic gastric cancer.  He is having significant pain at his J tube site.  He is also complaining of some drainage at the site.  He denies fever/ chills.  He came to urgent office last week and was placed on nystatin cream to the j tube site and oral antibiotics.     ROS - o/w negative.  EXAM: General:  Alert and oriented.   Abd s, nt, nd Incision:   Tube site with minimal erythema except around 8 mm around tube where bright red.  Some granulation tissue.  Purulent drainage right at tube.      ASSESSMENT AND PLAN:   Candidiasis around j tube. Failure of topical therapy.  Try diflucan orally.  Continue doxycycline.    Follow up in 6 weeks.    add zinc oxide to skin around tube.       Milus Height, MD Surgical Oncology, [facility]  Jamison Neighbor, MD

## 2013-12-13 NOTE — Assessment & Plan Note (Addendum)
Failure of topical therapy.  Try diflucan orally.  Continue doxycycline.    Follow up in 6 weeks.

## 2013-12-15 ENCOUNTER — Other Ambulatory Visit: Payer: Self-pay | Admitting: Oncology

## 2013-12-15 ENCOUNTER — Ambulatory Visit (HOSPITAL_BASED_OUTPATIENT_CLINIC_OR_DEPARTMENT_OTHER): Payer: BC Managed Care – PPO

## 2013-12-15 ENCOUNTER — Other Ambulatory Visit: Payer: Self-pay | Admitting: *Deleted

## 2013-12-15 VITALS — BP 126/69 | HR 98 | Temp 97.9°F | Resp 16

## 2013-12-15 VITALS — BP 103/68 | HR 110

## 2013-12-15 DIAGNOSIS — Z5112 Encounter for antineoplastic immunotherapy: Secondary | ICD-10-CM

## 2013-12-15 DIAGNOSIS — C772 Secondary and unspecified malignant neoplasm of intra-abdominal lymph nodes: Secondary | ICD-10-CM

## 2013-12-15 DIAGNOSIS — Z5111 Encounter for antineoplastic chemotherapy: Secondary | ICD-10-CM

## 2013-12-15 DIAGNOSIS — C169 Malignant neoplasm of stomach, unspecified: Secondary | ICD-10-CM

## 2013-12-15 DIAGNOSIS — C165 Malignant neoplasm of lesser curvature of stomach, unspecified: Secondary | ICD-10-CM

## 2013-12-15 LAB — CBC WITH DIFFERENTIAL/PLATELET
BASO%: 0.6 % (ref 0.0–2.0)
Basophils Absolute: 0.1 10*3/uL (ref 0.0–0.1)
EOS ABS: 0.3 10*3/uL (ref 0.0–0.5)
EOS%: 2.2 % (ref 0.0–7.0)
HCT: 37.6 % — ABNORMAL LOW (ref 38.4–49.9)
HEMOGLOBIN: 12.7 g/dL — AB (ref 13.0–17.1)
LYMPH%: 21.5 % (ref 14.0–49.0)
MCH: 34.2 pg — ABNORMAL HIGH (ref 27.2–33.4)
MCHC: 33.7 g/dL (ref 32.0–36.0)
MCV: 101.4 fL — ABNORMAL HIGH (ref 79.3–98.0)
MONO#: 2.1 10*3/uL — AB (ref 0.1–0.9)
MONO%: 16.5 % — ABNORMAL HIGH (ref 0.0–14.0)
NEUT%: 59.2 % (ref 39.0–75.0)
NEUTROS ABS: 7.6 10*3/uL — AB (ref 1.5–6.5)
PLATELETS: 557 10*3/uL — AB (ref 140–400)
RBC: 3.7 10*6/uL — AB (ref 4.20–5.82)
RDW: 15.7 % — AB (ref 11.0–14.6)
WBC: 12.9 10*3/uL — AB (ref 4.0–10.3)
lymph#: 2.8 10*3/uL (ref 0.9–3.3)

## 2013-12-15 LAB — COMPREHENSIVE METABOLIC PANEL (CC13)
ALT: 51 U/L (ref 0–55)
ANION GAP: 11 meq/L (ref 3–11)
AST: 52 U/L — ABNORMAL HIGH (ref 5–34)
Albumin: 3.2 g/dL — ABNORMAL LOW (ref 3.5–5.0)
Alkaline Phosphatase: 85 U/L (ref 40–150)
BUN: 14.5 mg/dL (ref 7.0–26.0)
CALCIUM: 9.4 mg/dL (ref 8.4–10.4)
CO2: 26 meq/L (ref 22–29)
Chloride: 97 mEq/L — ABNORMAL LOW (ref 98–109)
Creatinine: 0.8 mg/dL (ref 0.7–1.3)
GLUCOSE: 105 mg/dL (ref 70–140)
Potassium: 4.5 mEq/L (ref 3.5–5.1)
SODIUM: 134 meq/L — AB (ref 136–145)
TOTAL PROTEIN: 7.7 g/dL (ref 6.4–8.3)
Total Bilirubin: 0.2 mg/dL (ref 0.20–1.20)

## 2013-12-15 MED ORDER — OXYCODONE-ACETAMINOPHEN 5-325 MG PO TABS
ORAL_TABLET | ORAL | Status: AC
  Filled 2013-12-15: qty 1

## 2013-12-15 MED ORDER — SODIUM CHLORIDE 0.9 % IJ SOLN
10.0000 mL | INTRAMUSCULAR | Status: AC | PRN
  Administered 2013-12-15: 10 mL via INTRAVENOUS
  Filled 2013-12-15: qty 10

## 2013-12-15 MED ORDER — DEXAMETHASONE SODIUM PHOSPHATE 10 MG/ML IJ SOLN
INTRAMUSCULAR | Status: AC
  Filled 2013-12-15: qty 1

## 2013-12-15 MED ORDER — SODIUM CHLORIDE 0.9 % IV SOLN
Freq: Once | INTRAVENOUS | Status: AC
  Administered 2013-12-15: 11:00:00 via INTRAVENOUS

## 2013-12-15 MED ORDER — DEXTROSE 5 % IV SOLN
85.0000 mg/m2 | Freq: Once | INTRAVENOUS | Status: AC
  Administered 2013-12-15: 115 mg via INTRAVENOUS
  Filled 2013-12-15: qty 23

## 2013-12-15 MED ORDER — DEXAMETHASONE SODIUM PHOSPHATE 10 MG/ML IJ SOLN
10.0000 mg | Freq: Once | INTRAMUSCULAR | Status: AC
  Administered 2013-12-15: 10 mg via INTRAVENOUS

## 2013-12-15 MED ORDER — DIPHENHYDRAMINE HCL 50 MG/ML IJ SOLN
50.0000 mg | Freq: Once | INTRAMUSCULAR | Status: DC
  Administered 2013-12-15: 50 mg via INTRAVENOUS

## 2013-12-15 MED ORDER — PALONOSETRON HCL INJECTION 0.25 MG/5ML
0.2500 mg | Freq: Once | INTRAVENOUS | Status: AC
  Administered 2013-12-15: 0.25 mg via INTRAVENOUS

## 2013-12-15 MED ORDER — SODIUM CHLORIDE 0.9 % IV SOLN
2400.0000 mg/m2 | INTRAVENOUS | Status: DC
  Administered 2013-12-15: 3300 mg via INTRAVENOUS
  Filled 2013-12-15: qty 66

## 2013-12-15 MED ORDER — DIPHENHYDRAMINE HCL 25 MG PO CAPS: 50.0000 mg | ORAL_CAPSULE | Freq: Once | ORAL | Status: DC

## 2013-12-15 MED ORDER — PALONOSETRON HCL INJECTION 0.25 MG/5ML
INTRAVENOUS | Status: AC
  Filled 2013-12-15: qty 5

## 2013-12-15 MED ORDER — OXYCODONE HCL 5 MG/5ML PO SOLN: 10.0000 mg | ORAL | Status: DC | PRN

## 2013-12-15 MED ORDER — DIPHENHYDRAMINE HCL 50 MG/ML IJ SOLN
INTRAMUSCULAR | Status: AC
  Filled 2013-12-15: qty 1

## 2013-12-15 MED ORDER — SODIUM CHLORIDE 0.9 % IV SOLN
150.0000 mg | Freq: Once | INTRAVENOUS | Status: AC
  Administered 2013-12-15: 150 mg via INTRAVENOUS
  Filled 2013-12-15: qty 5

## 2013-12-15 MED ORDER — ACETAMINOPHEN 325 MG PO TABS
ORAL_TABLET | ORAL | Status: AC
  Filled 2013-12-15: qty 2

## 2013-12-15 MED ORDER — LORAZEPAM 0.5 MG PO TABS: 0.5000 mg | ORAL_TABLET | Freq: Three times a day (TID) | ORAL | Status: DC | PRN

## 2013-12-15 MED ORDER — SODIUM CHLORIDE 0.9 % IJ SOLN
10.0000 mL | INTRAMUSCULAR | Status: DC | PRN
  Filled 2013-12-15: qty 10

## 2013-12-15 MED ORDER — HEPARIN SOD (PORK) LOCK FLUSH 100 UNIT/ML IV SOLN
500.0000 [IU] | Freq: Once | INTRAVENOUS | Status: AC
  Administered 2013-12-15: 500 [IU] via INTRAVENOUS
  Filled 2013-12-15: qty 5

## 2013-12-15 MED ORDER — ACETAMINOPHEN 325 MG PO TABS
650.0000 mg | ORAL_TABLET | Freq: Once | ORAL | Status: AC
  Administered 2013-12-15: 650 mg via ORAL

## 2013-12-15 MED ORDER — OXYCODONE-ACETAMINOPHEN 5-325 MG PO TABS
1.0000 | ORAL_TABLET | Freq: Once | ORAL | Status: AC
  Administered 2013-12-15: 1

## 2013-12-15 MED ORDER — DEXTROSE 5 % IV SOLN
Freq: Once | INTRAVENOUS | Status: AC
  Administered 2013-12-15: 13:00:00 via INTRAVENOUS

## 2013-12-15 MED ORDER — LEUCOVORIN CALCIUM INJECTION 350 MG
550.0000 mg | Freq: Once | INTRAVENOUS | Status: AC
  Administered 2013-12-15: 550 mg via INTRAVENOUS
  Filled 2013-12-15: qty 27.5

## 2013-12-15 MED ORDER — FLUOROURACIL CHEMO INJECTION 2.5 GM/50ML
400.0000 mg/m2 | Freq: Once | INTRAVENOUS | Status: AC
  Administered 2013-12-15: 550 mg via INTRAVENOUS
  Filled 2013-12-15: qty 11

## 2013-12-15 MED ORDER — TRASTUZUMAB CHEMO INJECTION 440 MG
4.0000 mg/kg | Freq: Once | INTRAVENOUS | Status: AC
  Administered 2013-12-15: 189 mg via INTRAVENOUS
  Filled 2013-12-15: qty 9

## 2013-12-15 NOTE — Progress Notes (Signed)
Cmet results reviewed with MD as well as CBC. OK to treat based on these. Informed him that patient reports dark, almost black gastric contents-RN checked G-tube with syringe-no gastric contents aspirated. Hgb normal and has no dyspnea or dizziness. Made MD aware that his nausea and LUQ abdominal pain is been progressive, but pain medication still works. Last dose at 0730 today and is requesting pain med now. OK to treat & give pain medication-Percocet 5/325 X 1 dose.

## 2013-12-15 NOTE — Progress Notes (Signed)
Kennith Center, PharmD at [facility] informed infusion RN that pt is on bactrim and diflucan.  Diflucan can cause bactrim to have increased side effects.  Dr Benay Spice informed.  Per Dr Benay Spice, Dr Barry Dienes needs to be informed since she prescribed the medications.  Sent an in basket message to Dr Barry Dienes.  SLJ

## 2013-12-15 NOTE — Patient Instructions (Signed)
[place] Discharge Instructions for Patients Receiving Chemotherapy  Today you received the following chemotherapy agents: Herceptin, Oxaliplatin, Leucovorin & 5FU  To help prevent nausea and vomiting after your treatment, we encourage you to take your nausea medications as directed:  Zofran ODT 4 mg every 8 hours as needed or 4 mg in G-tube as needed  Ativan 0.5 mg under tongue every 8 hours as needed  If you develop nausea and vomiting that is not controlled by your nausea medication, call the clinic.   A stool softener like Colace (comes in liquid) taken daily could help your constipation also.  BELOW ARE SYMPTOMS THAT SHOULD BE REPORTED IMMEDIATELY:  *FEVER GREATER THAN 100.5 F  *CHILLS WITH OR WITHOUT FEVER  NAUSEA AND VOMITING THAT IS NOT CONTROLLED WITH YOUR NAUSEA MEDICATION  *UNUSUAL SHORTNESS OF BREATH  *UNUSUAL BRUISING OR BLEEDING  TENDERNESS IN MOUTH AND THROAT WITH OR WITHOUT PRESENCE OF ULCERS  *URINARY PROBLEMS  *BOWEL PROBLEMS  UNUSUAL RASH Items with * indicate a potential emergency and should be followed up as soon as possible.  Feel free to call the clinic should you have any questions or concerns. The clinic phone number is [phone].  It has been a pleasure to serve you today!

## 2013-12-17 ENCOUNTER — Ambulatory Visit (HOSPITAL_BASED_OUTPATIENT_CLINIC_OR_DEPARTMENT_OTHER): Payer: BC Managed Care – PPO

## 2013-12-17 VITALS — BP 130/74 | HR 96 | Temp 96.4°F

## 2013-12-17 DIAGNOSIS — C169 Malignant neoplasm of stomach, unspecified: Secondary | ICD-10-CM

## 2013-12-17 DIAGNOSIS — C165 Malignant neoplasm of lesser curvature of stomach, unspecified: Secondary | ICD-10-CM

## 2013-12-17 DIAGNOSIS — Z5189 Encounter for other specified aftercare: Secondary | ICD-10-CM

## 2013-12-17 DIAGNOSIS — C772 Secondary and unspecified malignant neoplasm of intra-abdominal lymph nodes: Secondary | ICD-10-CM

## 2013-12-17 MED ORDER — PEGFILGRASTIM INJECTION 6 MG/0.6ML
6.0000 mg | Freq: Once | SUBCUTANEOUS | Status: AC
  Administered 2013-12-17: 6 mg via SUBCUTANEOUS

## 2013-12-17 MED ORDER — HEPARIN SOD (PORK) LOCK FLUSH 100 UNIT/ML IV SOLN
500.0000 [IU] | Freq: Once | INTRAVENOUS | Status: AC | PRN
  Administered 2013-12-17: 500 [IU]
  Filled 2013-12-17: qty 5

## 2013-12-17 MED ORDER — SODIUM CHLORIDE 0.9 % IJ SOLN
10.0000 mL | INTRAMUSCULAR | Status: DC | PRN
  Administered 2013-12-17: 10 mL
  Filled 2013-12-17: qty 10

## 2013-12-17 NOTE — Patient Instructions (Signed)

## 2013-12-17 NOTE — Progress Notes (Signed)
Patient reports he has not had BM since speaking with this nurse on 12/15/13. Strongly encouraged him to take his stool softener today.

## 2013-12-22 ENCOUNTER — Other Ambulatory Visit: Payer: BC Managed Care – PPO

## 2013-12-22 ENCOUNTER — Ambulatory Visit: Payer: BC Managed Care – PPO

## 2013-12-22 ENCOUNTER — Ambulatory Visit: Payer: BC Managed Care – PPO | Admitting: Oncology

## 2013-12-25 ENCOUNTER — Other Ambulatory Visit: Payer: Self-pay | Admitting: Oncology

## 2013-12-28 ENCOUNTER — Telehealth: Payer: Self-pay | Admitting: *Deleted

## 2013-12-28 ENCOUNTER — Emergency Department (HOSPITAL_COMMUNITY): Payer: BC Managed Care – PPO

## 2013-12-28 ENCOUNTER — Emergency Department (HOSPITAL_COMMUNITY)
Admission: EM | Admit: 2013-12-28 | Discharge: 2013-12-28 | Disposition: A | Discharge status: Home / Self Care | Payer: BC Managed Care – PPO | Attending: Emergency Medicine | Admitting: Emergency Medicine

## 2013-12-28 ENCOUNTER — Other Ambulatory Visit: Payer: Self-pay | Admitting: Nurse Practitioner

## 2013-12-28 ENCOUNTER — Other Ambulatory Visit: Payer: Self-pay | Admitting: *Deleted

## 2013-12-28 ENCOUNTER — Encounter (HOSPITAL_COMMUNITY): Payer: Self-pay | Admitting: Emergency Medicine

## 2013-12-28 DIAGNOSIS — T85528A Displacement of other gastrointestinal prosthetic devices, implants and grafts, initial encounter: Secondary | ICD-10-CM

## 2013-12-28 DIAGNOSIS — K9423 Gastrostomy malfunction: Secondary | ICD-10-CM | POA: Insufficient documentation

## 2013-12-28 DIAGNOSIS — Z87891 Personal history of nicotine dependence: Secondary | ICD-10-CM | POA: Insufficient documentation

## 2013-12-28 DIAGNOSIS — IMO0002 Reserved for concepts with insufficient information to code with codable children: Secondary | ICD-10-CM

## 2013-12-28 DIAGNOSIS — C169 Malignant neoplasm of stomach, unspecified: Secondary | ICD-10-CM

## 2013-12-28 DIAGNOSIS — Z79899 Other long term (current) drug therapy: Secondary | ICD-10-CM | POA: Insufficient documentation

## 2013-12-28 DIAGNOSIS — Y833 Surgical operation with formation of external stoma as the cause of abnormal reaction of the patient, or of later complication, without mention of misadventure at the time of the procedure: Secondary | ICD-10-CM | POA: Insufficient documentation

## 2013-12-28 LAB — CBC WITH DIFFERENTIAL/PLATELET
BAND NEUTROPHILS: 0 % (ref 0–10)
BASOS ABS: 0 10*3/uL (ref 0.0–0.1)
BLASTS: 0 %
Basophils Relative: 0 % (ref 0–1)
Eosinophils Absolute: 0.7 10*3/uL (ref 0.0–0.7)
Eosinophils Relative: 2 % (ref 0–5)
HEMATOCRIT: 35 % — AB (ref 39.0–52.0)
Hemoglobin: 12 g/dL — ABNORMAL LOW (ref 13.0–17.0)
Lymphocytes Relative: 8 % — ABNORMAL LOW (ref 12–46)
Lymphs Abs: 2.9 10*3/uL (ref 0.7–4.0)
MCH: 34.6 pg — ABNORMAL HIGH (ref 26.0–34.0)
MCHC: 34.3 g/dL (ref 30.0–36.0)
MCV: 100.9 fL — ABNORMAL HIGH (ref 78.0–100.0)
MONO ABS: 4.4 10*3/uL — AB (ref 0.1–1.0)
MONOS PCT: 12 % (ref 3–12)
Metamyelocytes Relative: 0 %
Myelocytes: 3 %
Neutro Abs: 28.7 10*3/uL — ABNORMAL HIGH (ref 1.7–7.7)
Neutrophils Relative %: 75 % (ref 43–77)
Platelets: 393 10*3/uL (ref 150–400)
Promyelocytes Absolute: 0 %
RBC: 3.47 MIL/uL — ABNORMAL LOW (ref 4.22–5.81)
RDW: 15.9 % — AB (ref 11.5–15.5)
WBC: 36.7 10*3/uL — AB (ref 4.0–10.5)
nRBC: 1 /100 WBC — ABNORMAL HIGH

## 2013-12-28 LAB — LIPASE, BLOOD: LIPASE: 24 U/L (ref 11–59)

## 2013-12-28 LAB — URINALYSIS, ROUTINE W REFLEX MICROSCOPIC
Bilirubin Urine: NEGATIVE
GLUCOSE, UA: NEGATIVE mg/dL
Hgb urine dipstick: NEGATIVE
KETONES UR: NEGATIVE mg/dL
LEUKOCYTES UA: NEGATIVE
Nitrite: NEGATIVE
PROTEIN: NEGATIVE mg/dL
Specific Gravity, Urine: 1.038 — ABNORMAL HIGH (ref 1.005–1.030)
Urobilinogen, UA: 0.2 mg/dL (ref 0.0–1.0)
pH: 8.5 — ABNORMAL HIGH (ref 5.0–8.0)

## 2013-12-28 LAB — COMPREHENSIVE METABOLIC PANEL
ALK PHOS: 120 U/L — AB (ref 39–117)
ALT: 32 U/L (ref 0–53)
AST: 29 U/L (ref 0–37)
Albumin: 2.9 g/dL — ABNORMAL LOW (ref 3.5–5.2)
BUN: 8 mg/dL (ref 6–23)
CALCIUM: 8.8 mg/dL (ref 8.4–10.5)
CO2: 27 mEq/L (ref 19–32)
CREATININE: 0.68 mg/dL (ref 0.50–1.35)
Chloride: 94 mEq/L — ABNORMAL LOW (ref 96–112)
GFR calc Af Amer: >90 mL/min (ref >90)
GFR calc non Af Amer: >90 mL/min (ref >90)
Glucose, Bld: 100 mg/dL — ABNORMAL HIGH (ref 70–99)
POTASSIUM: 4.3 meq/L (ref 3.7–5.3)
Sodium: 134 mEq/L — ABNORMAL LOW (ref 137–147)
TOTAL PROTEIN: 6.5 g/dL (ref 6.0–8.3)
Total Bilirubin: <0.2 mg/dL — ABNORMAL LOW (ref 0.3–1.2)

## 2013-12-28 MED ORDER — ONDANSETRON HCL 4 MG/2ML IJ SOLN
4.0000 mg | Freq: Once | INTRAMUSCULAR | Status: AC
  Administered 2013-12-28: 4 mg via INTRAVENOUS
  Filled 2013-12-28: qty 2

## 2013-12-28 MED ORDER — VANCOMYCIN HCL IN DEXTROSE 1-5 GM/200ML-% IV SOLN
1000.0000 mg | Freq: Once | INTRAVENOUS | Status: AC
  Administered 2013-12-28: 1000 mg via INTRAVENOUS
  Filled 2013-12-28: qty 200

## 2013-12-28 MED ORDER — LIDOCAINE HCL 2 % EX GEL
Freq: Once | CUTANEOUS | Status: AC
Start: 2013-12-28 — End: 2013-12-28
  Administered 2013-12-28: 14:00:00 via TOPICAL
  Filled 2013-12-28: qty 10

## 2013-12-28 MED ORDER — LIDOCAINE VISCOUS 2 % MT SOLN
15.0000 mL | Freq: Once | OROMUCOSAL | Status: AC
  Administered 2013-12-28: 15 mL via OROMUCOSAL
  Filled 2013-12-28: qty 15

## 2013-12-28 MED ORDER — HYDROMORPHONE HCL PF 1 MG/ML IJ SOLN
1.0000 mg | Freq: Once | INTRAMUSCULAR | Status: AC
  Administered 2013-12-28: 1 mg via INTRAVENOUS
  Filled 2013-12-28: qty 1

## 2013-12-28 MED ORDER — LIDOCAINE VISCOUS 2 % MT SOLN
OROMUCOSAL | Status: AC
  Filled 2013-12-28: qty 15

## 2013-12-28 MED ORDER — IOHEXOL 300 MG/ML  SOLN
50.0000 mL | Freq: Once | INTRAMUSCULAR | Status: AC | PRN
  Administered 2013-12-28: 50 mL via ORAL

## 2013-12-28 MED ORDER — HYDROCODONE-ACETAMINOPHEN 7.5-325 MG/15ML PO SOLN: 15.0000 mL | Freq: Four times a day (QID) | ORAL | Status: DC | PRN

## 2013-12-28 MED ORDER — OXYCODONE HCL 5 MG/5ML PO SOLN: 10.0000 mg | ORAL | Status: DC | PRN

## 2013-12-28 MED ORDER — HYDROMORPHONE HCL PF 1 MG/ML IJ SOLN: 1.0000 mg | Freq: Once | INTRAMUSCULAR | Status: AC

## 2013-12-28 MED ORDER — SODIUM CHLORIDE 0.9 % IV BOLUS (SEPSIS)
1000.0000 mL | Freq: Once | INTRAVENOUS | Status: AC
  Administered 2013-12-28: 1000 mL via INTRAVENOUS

## 2013-12-28 MED ORDER — PIPERACILLIN-TAZOBACTAM 3.375 G IVPB 30 MIN
3.3750 g | Freq: Once | INTRAVENOUS | Status: AC
  Administered 2013-12-28: 3.375 g via INTRAVENOUS
  Filled 2013-12-28: qty 50

## 2013-12-28 MED ORDER — IOHEXOL 300 MG/ML  SOLN
80.0000 mL | Freq: Once | INTRAMUSCULAR | Status: AC | PRN
  Administered 2013-12-28: 80 mL via INTRAVENOUS

## 2013-12-28 MED ORDER — HYDROMORPHONE HCL PF 2 MG/ML IJ SOLN
INTRAMUSCULAR | Status: AC
  Administered 2013-12-28: 1 mg
  Filled 2013-12-28: qty 1

## 2013-12-28 MED ORDER — LIDOCAINE HCL 1 % IJ SOLN
INTRAMUSCULAR | Status: AC
  Filled 2013-12-28: qty 20

## 2013-12-28 MED ORDER — MORPHINE SULFATE 4 MG/ML IJ SOLN
4.0000 mg | Freq: Once | INTRAMUSCULAR | Status: AC
Start: 2013-12-28 — End: 2013-12-28
  Administered 2013-12-28: 4 mg via INTRAVENOUS
  Filled 2013-12-28: qty 1

## 2013-12-28 MED ORDER — AMOXICILLIN-POT CLAVULANATE 400-57 MG/5ML PO SUSR: 875.0000 mg | Freq: Two times a day (BID) | ORAL | Status: DC

## 2013-12-28 MED ORDER — SODIUM BICARBONATE 4 % IV SOLN
INTRAVENOUS | Status: AC
  Filled 2013-12-28: qty 5

## 2013-12-28 NOTE — ED Notes (Signed)
Visitor at bedside and updated on pt status ( in Interventional Radiology) and will return to exam room 24.

## 2013-12-28 NOTE — ED Notes (Signed)
Pt presents to ED with c/o dislodgement of PEG tube.  Pt reports that peg tube "just came out" at around 0400; pt denies any accidental pulling or tagging.  Pt states that peg tube was placed last December 2014.  Pt reports pain to peg site---- some swelling and redness around peg site observed.

## 2013-12-28 NOTE — ED Provider Notes (Signed)
CSN: [id number]     Arrival date & time 12/28/13  0455 History   First MD Initiated Contact with Patient 12/28/13 [phone]     Chief Complaint  Patient presents with  . Dislodged PEG Tube      (Consider location/radiation/quality/duration/timing/severity/associated sxs/prior Treatment) HPI Comments: Patient is a 53 year old male with history of stage IV gastric cancer who presents today after his PEG tube fell out. He reports that it fell out around 4am. He states it has been gradually getting looser since the nurses have been using it. He additionally has gradually worsening pain and redness around the area. He reports that over the past 2 weeks this has been worsening. He has been placed on antibiotics for this which do not seem to be helping. He has associated nausea without vomiting.   The history is provided by the patient. No language interpreter was used.    Past Medical History  Diagnosis Date  . Cancer     stomach   Past Surgical History  Procedure Laterality Date  . Upper gi endoscopy    . Peg placement N/A 08/05/2013    Procedure:  GASTROSTOMY (PEG) PLACEMENT;  Surgeon: Stark Klein, MD;  Location: [facility];  Service: General;  Laterality: N/A;  . Portacath placement Left 09/14/2013    Procedure: INSERTION PORT-A-CATH;  Surgeon: Stark Klein, MD;  Location: [facility];  Service: General;  Laterality: Left;  . Gastrectomy N/A 08/05/2013    Procedure: Laparoscopy Diagnostic  ;  Surgeon: Stark Klein, MD;  Location: [facility];  Service: General;  Laterality: N/A;   History reviewed. No pertinent family history. History  Substance Use Topics  . Smoking status: Former Smoker -- 0.50 packs/day for 35 years    Types: Cigarettes    Quit date: 08/05/2013  . Smokeless tobacco: Never Used  . Alcohol Use: No    Review of Systems  Constitutional: Negative for fever and chills.  Respiratory: Negative for shortness of breath.   Cardiovascular: Negative for chest pain.  Gastrointestinal:  Positive for nausea and abdominal pain. Negative for vomiting.  All other systems reviewed and are negative.     Allergies  Asa and Clarithromycin  Home Medications   Prior to Admission medications   Medication Sig Start Date End Date Taking? Authorizing Provider  baclofen (LIORESAL) 10 MG tablet Take 1 tablet (10 mg total) by mouth 2 (two) times daily as needed for muscle spasms. 11/04/13 11/04/14 Yes Stark Klein, MD  docusate sodium (COLACE) 100 MG capsule Take 100 mg by mouth every other day.    Yes Historical Provider, MD  lidocaine-prilocaine (EMLA) cream Apply 1 application topically as needed. Apply to [facility] site 1-2 hours prior to stick and cover with plastic wrap to numb site 09/07/13  Yes Ladell Pier, MD  LORazepam (ATIVAN) 0.5 MG tablet Place 1 tablet (0.5 mg total) under the tongue every 8 (eight) hours as needed for anxiety (or nausea). 12/15/13  Yes Ladell Pier, MD  Nutritional Supplements (FEEDING SUPPLEMENT, OSMOLITE 1.5 CAL,) LIQD Change Osmolite 1.5 to 100 mL for 14 hours daily (6 cans) with 180 mL free water flush 5 times daily.  Order pump that will dispense 30 mL water q hour while continuous feedings are running.  This is patient's sole source of nutrition. 09/07/13  Yes Ladell Pier, MD  ondansetron [facility]) 4 MG/5ML solution Place 5 mLs (4 mg total) into feeding tube every 8 (eight) hours as needed for nausea or vomiting. 09/14/13  Yes  Stark Klein, MD  oxyCODONE (ROXICODONE) 5 MG/5ML solution Take 10 mLs (10 mg total) by mouth every 4 (four) hours as needed for severe pain. 12/15/13  Yes Ladell Pier, MD  Skin Protectants, Misc. (DIMETHICONE-ZINC OXIDE) cream Apply to g tube site 1-2x/day. 12/12/13  Yes Stark Klein, MD  sulfamethoxazole-trimethoprim (BACTRIM,SEPTRA) 200-40 MG/5ML suspension Place 20 mLs into feeding tube 2 (two) times daily. 12/08/13  Yes Zenovia Jarred, MD   BP 124/74  Pulse 88  Temp(Src) 98.5 F (36.9 C) (Oral)  Resp 18  SpO2  95% Physical Exam  Nursing note and vitals reviewed. Constitutional: He is oriented to person, place, and time. He appears well-developed and well-nourished. No distress.  frail  HENT:  Head: Normocephalic and atraumatic.  Right Ear: External ear normal.  Left Ear: External ear normal.  Nose: Nose normal.  Eyes: Conjunctivae are normal.  Neck: Normal range of motion. No tracheal deviation present.  Cardiovascular: Normal rate, regular rhythm and normal heart sounds.   Pulmonary/Chest: Effort normal and breath sounds normal. No stridor.  Abdominal: Soft. He exhibits no distension. There is generalized tenderness. There is guarding.  Abdomen is exquisitely tender to palpation. Patient reports this is not new for him since dx of gastric cancer.   Musculoskeletal: Normal range of motion.  Neurological: He is alert and oriented to person, place, and time.  Skin: Skin is warm and dry. He is not diaphoretic.  Psychiatric: He has a normal mood and affect. His behavior is normal.    ED Course  Procedures (including critical care time) Labs Review Labs Reviewed  CBC WITH DIFFERENTIAL - Abnormal; Notable for the following:    WBC 36.7 (*)    RBC 3.47 (*)    Hemoglobin 12.0 (*)    HCT 35.0 (*)    MCV 100.9 (*)    MCH 34.6 (*)    RDW 15.9 (*)    Lymphocytes Relative 8 (*)    nRBC 1 (*)    Neutro Abs 28.7 (*)    Monocytes Absolute 4.4 (*)    All other components within normal limits  COMPREHENSIVE METABOLIC PANEL - Abnormal; Notable for the following:    Sodium 134 (*)    Chloride 94 (*)    Glucose, Bld 100 (*)    Albumin 2.9 (*)    Alkaline Phosphatase 120 (*)    Total Bilirubin <0.2 (*)    All other components within normal limits  URINALYSIS, ROUTINE W REFLEX MICROSCOPIC - Abnormal; Notable for the following:    Specific Gravity, Urine 1.038 (*)    pH 8.5 (*)    All other components within normal limits  CULTURE, BLOOD (ROUTINE X 2)  CULTURE, BLOOD (ROUTINE X 2)  URINE  CULTURE  LIPASE, BLOOD    Imaging Review Ct Abdomen Pelvis W Contrast  12/28/2013   CLINICAL DATA:  Dislodged PEG tube, abdominal pain, leukocytosis, evaluate for PEG site infection/abscess  EXAM: CT ABDOMEN AND PELVIS WITH CONTRAST  TECHNIQUE: Multidetector CT imaging of the abdomen and pelvis was performed using the standard protocol following bolus administration of intravenous contrast.  CONTRAST:  12mL OMNIPAQUE IOHEXOL 300 MG/ML SOLN, 68mL OMNIPAQUE IOHEXOL 300 MG/ML SOLN  COMPARISON:  PET-CT dated 08/03/2013.  FINDINGS: Minimal scarring at the lung bases.  Diffuse wall thickening along the distal gastric body/antrum (series 2/ image 27), compatible with known gastric cancer.  Liver, spleen, pancreas, and adrenal glands are within normal limits.  Gallbladder is unremarkable. No intrahepatic or extrahepatic ductal dilatation.  3 mm nonobstructing interpolar right renal calculus (series 2/image 34). 8 mm lateral left lower pole renal cyst (series 3/image 18). No hydronephrosis.  Percutaneous jejunostomy tract with associated soft tissue/complex fluid along the left mid abdominal wall (series 2/ image 41). While this suggests infection, there is no drainable fluid collection/ abscess.  No evidence of bowel obstruction.  Atherosclerotic calcifications of the abdominal aorta and branch vessels.  No abdominopelvic ascites.  Upper abdominal/retroperitoneal lymphadenopathy, including:  --1.5 cm short axis gastrohepatic node (series 2/image 19), grossly unchanged  --1.3 cm short axis left para-aortic node (series 2/ image 23), grossly unchanged  Prostate is unremarkable.  Bladder is within normal limits.  Very mild degenerative changes of the visualized thoracolumbar spine.  IMPRESSION: Suspected infection/inflammation along the percutaneous jejunostomy tract in the left mid abdominal wall.  No drainable fluid collection/abscess.  Stable diffuse gastric wall thickening, compatible with known gastric cancer.   Stable upper abdominal/retroperitoneal nodal metastases.   Electronically Signed   By: Julian Hy M.D.   On: 12/28/2013 11:42   Ir Duoden/jejuno Tube Insert Percut W/fl Mod Sec  12/28/2013   CLINICAL DATA:  History of gastric carcinoma with indwelling jejunostomy tube. This tube has fallen out. The patient also has had recent significant pain and irritation at the jejunostomy exit site.  EXAM: JEJUNOSTOMY TUBE EXCHANGE  CONTRAST:  20 ml Omnipaque-300  FLUOROSCOPY TIME:  2 min and 24 seconds.  PROCEDURE: The procedure, risks, benefits, and alternatives were explained to the patient. Questions regarding the procedure were encouraged and answered. The patient understands and consents to the procedure.  The jejunostomy exit site of the left lower quadrant was prepped with Betadine in a sterile fashion, and a sterile drape was applied covering the operative field. Sterile gowns and sterile gloves were used for the procedure.  Viscous lidocaine was injected into the jejunostomy tract. 1% lidocaine was also infiltrated around the tube exit site. A 5 French catheter was advanced through the as a site. Contrast injection was performed. A Glidewire was advanced into the jejunum. Over this wire, a 41 French balloon retention jejunostomy catheter was advanced. The length of this catheter was cut short by approximately 8 cm. The retention balloon was advanced into the jejunal limb and gently inflated with 5 mL of saline. The tube was injected with contrast material. Final catheter position was confirmed with a fluoroscopic spot image obtained after injection of contrast.  COMPLICATIONS: None.  FINDINGS: A 5 French catheter was able to be easily advanced into the jejunal lumen. Due to extreme pain at the catheter exit site, a 7 French jejunostomy was placed. The original jejunostomy catheter was 20 [place]. The tip of the catheter lies well into the jejunum and the catheter is ready for use.  IMPRESSION: Replacement of  dislodged tracheostomy with new 75 French balloon retention catheter. The tip lies in the jejunum.   Electronically Signed   By: Aletta Edouard M.D.   On: 12/28/2013 17:17   Dg Abd Acute W/chest  12/28/2013   CLINICAL DATA:  Pain, peg tube fell out.  EXAM: ACUTE ABDOMEN SERIES (ABDOMEN 2 VIEW & CHEST 1 VIEW)  COMPARISON:  DG CHEST 1V PORT dated 09/14/2013  FINDINGS: Cardiomediastinal silhouette is unremarkable. Lungs are clear, no pleural effusions. No pneumothorax. Single lumen left chest Port-A-Cath with distal tip projecting in the distal superior vena cava. Soft tissue planes and included osseous structures are unremarkable.  Bowel gas pattern is nondilated and nonobstructive. Moderate amount of retained large bowel stool.  No intra-abdominal mass effect, pathologic calcifications or free air. Soft tissue planes and included osseous structures are nonsuspicious.  IMPRESSION: No acute cardiopulmonary process, stable appearance of the chest.  Moderate amount of retained large bowel stool without obstruction.   Electronically Signed   By: Elon Alas   On: 12/28/2013 06:57     EKG Interpretation None      7:41 AM Discussed case with Dr. Excell Seltzer who would like to be called when the CT scan has been completed.   MDM   Final diagnoses:  Jejunostomy tube fell out  Gastric cancer    Patient presents to ED after his J tube fell out. There appears to be an area of cellulitis surrounding the J tube site. CT abd was done which shows no intraabdominal abscess. Surgery was consulted and evaluated patient. After discussion with surgery, oncology, and medicine the decision was made to have the J tube replaced by IR and have patient sent home on augmentin. The patient will follow up with oncology in 1 week and surgery in 2 weeks. Patient's leukocytosis likely due to his recent chemotherapy treatment and is not concerning. Patient signed out to [place], PA-C at change of shift. Patient is currently  still in IR at this time. If IR is success replacing the J tube, patient can go home. If unsuccessful, re page surgery. Patient is hemodynamically stable at this time. Patient / Family / Caregiver informed of clinical course, understand medical decision-making process, and agree with plan.  1:16 PM Discussed case with oncology. WBC count likely due to treatment. Dr. Ammie Dalton will see tomorrow.   Elwyn Lade, PA-C 12/29/13 [phone]

## 2013-12-28 NOTE — Procedures (Signed)
Procedure:  Jejunostomy replacement Exit site and tract extremely tender.  Able to advance a 16 Fr balloon retention J-tube into jejunum.  Retention balloon injected with 5 mL of saline.  Tip well into jejunum.  OK to use.

## 2013-12-28 NOTE — ED Notes (Signed)
Tolerating po contrast.

## 2013-12-28 NOTE — ED Notes (Signed)
Pt in interventional radiology for PEG tube Placement.

## 2013-12-28 NOTE — Consult Note (Signed)
Patient interviewed and examined, agree with PA note above.  Edward Jolly MD, FACS  12/28/2013 5:52 PM

## 2013-12-28 NOTE — ED Provider Notes (Signed)
PROGRESS NOTE                                                                                                                 This is a sign-out from [place] at shift change: Evan Peterson is a 53 y.o. male presenting with dislodged G-tube and associated mild surrounding cellulitis plan is to follow patient to make sure interventional radiologic placement of J-tube is successful and discharged home with Augmentin and high set for pain in treatment of cellulitis.  Patient seen and evaluated the bedside, he is resting comfortably. G-tube has been placed by interventional radiology. Heart is regular rate and rhythm without murmurs rubs or gallops, lung sounds are clear to auscultation bilaterally. J-tube placement on the left side with mild surrounding skin breakdown and bleeding. No significant induration. Advised patient of plan to treat cellulitis and pain. Patient had no questions prior to discharge, discharged in stable condition.   Filed Vitals:   12/28/13 0514 12/28/13 1718  BP: 128/83 124/74  Pulse: 112 88  Temp: 98.8 F (37.1 C) 98.5 F (36.9 C)  TempSrc: Oral Oral  Resp: 18 18  SpO2: 95% 95%    Medications  lidocaine (XYLOCAINE) 2 % viscous mouth solution (not administered)  lidocaine (XYLOCAINE) 1 % (with pres) injection (not administered)  sodium bicarbonate (NEUT) 4 % injection (not administered)  sodium chloride 0.9 % bolus 1,000 mL (0 mLs Intravenous Stopped 12/28/13 0746)  morphine 4 MG/ML injection 4 mg (4 mg Intravenous Given 12/28/13 0657)  ondansetron (ZOFRAN) injection 4 mg (4 mg Intravenous Given 12/28/13 0656)  iohexol (OMNIPAQUE) 300 MG/ML solution 50 mL (50 mLs Oral Contrast Given 12/28/13 0820)  iohexol (OMNIPAQUE) 300 MG/ML solution 80 mL (80 mLs Intravenous Contrast Given 12/28/13 1045)  vancomycin (VANCOCIN) IVPB 1000 mg/200 mL premix (0 mg Intravenous Stopped 12/28/13 1454)  piperacillin-tazobactam (ZOSYN) IVPB 3.375 g (0 g Intravenous Stopped 12/28/13 1352)   HYDROmorphone (DILAUDID) injection 1 mg (1 mg Intravenous Given 12/28/13 1249)  ondansetron (ZOFRAN) injection 4 mg (4 mg Intravenous Given 12/28/13 1249)  lidocaine (XYLOCAINE) 2 % jelly ( Topical Given 12/28/13 1332)  lidocaine (XYLOCAINE) 2 % viscous mouth solution 15 mL (15 mLs Mouth/Throat Given 12/28/13 1517)  HYDROmorphone (DILAUDID) injection 1 mg (0 mg Intravenous Duplicate 1/61/09 6045)  HYDROmorphone (DILAUDID) 2 MG/ML injection (1 mg  Given 12/28/13 1621)    Evaluation does not show pathology that would require ongoing emergent intervention or inpatient treatment. Pt is hemodynamically stable and mentating appropriately. Discussed findings and plan with patient/guardian, who agrees with care plan. All questions answered. Return precautions discussed and outpatient follow up given.   New Prescriptions   AMOXICILLIN-CLAVULANATE (AUGMENTIN) 400-57 MG/5ML SUSPENSION    Place 10.9 mLs (875 mg total) into feeding tube 2 (two) times daily.   HYDROCODONE-ACETAMINOPHEN (HYCET) 7.5-325 MG/15 ML SOLUTION    Place 15 mLs into feeding tube every 6 (six) hours as needed for moderate pain.    Note: Portions of this report may have been transcribed using voice recognition  software. Every effort was made to ensure accuracy; however, inadvertent computerized transcription errors may be present   Monico Blitz, PA-C 12/28/13 1722

## 2013-12-28 NOTE — ED Notes (Signed)
Off floor to IR.

## 2013-12-28 NOTE — ED Notes (Signed)
Surgery at bedside.

## 2013-12-28 NOTE — ED Notes (Signed)
Off floor for testing 

## 2013-12-28 NOTE — Consult Note (Addendum)
Reason for Consult:  Loss ofJejunostomy tube with local cellulitis.   Referring Physician: Cleatrice Burke PA-C  Evan Peterson is an 53 y.o. male.  HPI: 53 Y/o male with gastric cancer, s/p Diagnostic laparoscopy, mesenteric biopsies, peritoneal biopsies, jejunostomy tube, 12/191/4 by Dr. Barry Dienes.  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 He has been undergoing chemotherapy and using a jejunostomy feeding tube since that time.  His last chemotherapy was about 10 days ago and he is due for another treatment later this week. He has been treated from the office for some cellulitis around the jejunostomy tube back on 11/03/13. He was seen 3/20 and was unable to eat anything and had 5-88m of redness around the jejunostomy tube.  On 4/23 he had Bactrim elixir ordered.  Last note from Dr. BBarry Dieneson 4/28 shows he was now on Doxycycline and Diflucan.  Topical therapy had failed. He says he lost the jejunostomy tube early today about 4 AM.  He was seen in the ED.  He is tender all over he has some erythema lateral abdominal wall.  He is tender all over and doesn't really want you to palpate the left side.  On exam there is nothing to account for his elevated WBC 36.7.  His last Chemotherapy was on 12/01/13 and he is due for another treatment this week.  CT scan today shows:  Percutaneous jejunostomy tract with associated soft tissue/complex fluid along the left mid abdominal wall (series 2/ image 41). While this suggests infection, there is no drainable fluid collection/abscess.  We were ask to see, and exam does not explain his elevated WBC, nor does the CT scan.  He needs to have the jejunostomy tube replaced and Medicine needs to see and evaluate for his high leukocytosis in a patient on chemotherapy.   We advised ED to start on broad spectrum antibiotic that would cover GI organisms.  We will follow with you.    Past Medical History  Diagnosis Date  . Cancer     stomach    Past Surgical  History  Procedure Laterality Date  . Upper gi endoscopy    . Peg placement N/A 08/05/2013    Procedure:  GASTROSTOMY (PEG) PLACEMENT;  Surgeon: FStark Klein MD;  Location: [facility];  Service: General;  Laterality: N/A;  . Portacath placement Left 09/14/2013    Procedure: INSERTION PORT-A-CATH;  Surgeon: FStark Klein MD;  Location: [facility];  Service: General;  Laterality: Left;  . Gastrectomy N/A 08/05/2013    Procedure: Laparoscopy Diagnostic  ;  Surgeon: FStark Klein MD;  Location: [facility];  Service: General;  Laterality: N/A;    History reviewed. No pertinent family history.  Social History:  reports that he quit smoking about 4 months ago. His smoking use included Cigarettes. He has a 17.5 pack-year smoking history. He has never used smokeless tobacco. He reports that he does not drink alcohol or use illicit drugs.  Allergies:  Allergies  Allergen Reactions  . Asa [Aspirin] Nausea And Vomiting  . Clarithromycin Itching    Medications:  Prior to Admission:  (Not in a hospital admission) Scheduled: . lidocaine   Topical Once   Continuous: . piperacillin-tazobactam 3.375 g (12/28/13 1305)  . vancomycin     PRN: Anti-infectives   Start     Dose/Rate Route Frequency Ordered Stop   12/28/13 1230  vancomycin (VANCOCIN) IVPB 1000 mg/200 mL premix     1,000 mg 200 mL/hr over 60 Minutes Intravenous  Once 12/28/13 1228  12/28/13 1230  piperacillin-tazobactam (ZOSYN) IVPB 3.375 g     3.375 g 100 mL/hr over 30 Minutes Intravenous  Once 12/28/13 1228        Results for orders placed during the hospital encounter of 12/28/13 (from the past 48 hour(s))  CBC WITH DIFFERENTIAL     Status: Abnormal   Collection Time    12/28/13  6:40 AM      Result Value Ref Range   WBC 36.7 (*) 4.0 - 10.5 K/uL   RBC 3.47 (*) 4.22 - 5.81 MIL/uL   Hemoglobin 12.0 (*) 13.0 - 17.0 g/dL   HCT 35.0 (*) 39.0 - 52.0 %   MCV 100.9 (*) 78.0 - 100.0 fL   MCH 34.6 (*) 26.0 - 34.0 pg   MCHC 34.3  30.0 -  36.0 g/dL   RDW 15.9 (*) 11.5 - 15.5 %   Platelets 393  150 - 400 K/uL   Neutrophils Relative % 75  43 - 77 %   Lymphocytes Relative 8 (*) 12 - 46 %   Monocytes Relative 12  3 - 12 %   Eosinophils Relative 2  0 - 5 %   Basophils Relative 0  0 - 1 %   Band Neutrophils 0  0 - 10 %   Metamyelocytes Relative 0     Myelocytes 3     Promyelocytes Absolute 0     Blasts 0     nRBC 1 (*) 0 /100 WBC   Neutro Abs 28.7 (*) 1.7 - 7.7 K/uL   Lymphs Abs 2.9  0.7 - 4.0 K/uL   Monocytes Absolute 4.4 (*) 0.1 - 1.0 K/uL   Eosinophils Absolute 0.7  0.0 - 0.7 K/uL   Basophils Absolute 0.0  0.0 - 0.1 K/uL   RBC Morphology POLYCHROMASIA PRESENT     Comment: RARE NRBCs   WBC Morphology       Value: MODERATE LEFT SHIFT (>5% METAS AND MYELOS,OCC PRO NOTED)   Smear Review LARGE PLATELETS PRESENT    COMPREHENSIVE METABOLIC PANEL     Status: Abnormal   Collection Time    12/28/13  6:40 AM      Result Value Ref Range   Sodium 134 (*) 137 - 147 mEq/L   Potassium 4.3  3.7 - 5.3 mEq/L   Chloride 94 (*) 96 - 112 mEq/L   CO2 27  19 - 32 mEq/L   Glucose, Bld 100 (*) 70 - 99 mg/dL   BUN 8  6 - 23 mg/dL   Creatinine, Ser 0.68  0.50 - 1.35 mg/dL   Calcium 8.8  8.4 - 10.5 mg/dL   Total Protein 6.5  6.0 - 8.3 g/dL   Albumin 2.9 (*) 3.5 - 5.2 g/dL   AST 29  0 - 37 U/L   ALT 32  0 - 53 U/L   Alkaline Phosphatase 120 (*) 39 - 117 U/L   Total Bilirubin <0.2 (*) 0.3 - 1.2 mg/dL   GFR calc non Af Amer >90  >90 mL/min   GFR calc Af Amer >90  >90 mL/min   Comment: (NOTE)     The eGFR has been calculated using the CKD EPI equation.     This calculation has not been validated in all clinical situations.     eGFR's persistently <90 mL/min signify possible Chronic Kidney     Disease.  LIPASE, BLOOD     Status: None   Collection Time    12/28/13  6:40 AM  Result Value Ref Range   Lipase 24  11 - 59 U/L    Ct Abdomen Pelvis W Contrast  12/28/2013   CLINICAL DATA:  Dislodged PEG tube, abdominal pain,  leukocytosis, evaluate for PEG site infection/abscess  EXAM: CT ABDOMEN AND PELVIS WITH CONTRAST  TECHNIQUE: Multidetector CT imaging of the abdomen and pelvis was performed using the standard protocol following bolus administration of intravenous contrast.  CONTRAST:  48m OMNIPAQUE IOHEXOL 300 MG/ML SOLN, 857mOMNIPAQUE IOHEXOL 300 MG/ML SOLN  COMPARISON:  PET-CT dated 08/03/2013.  FINDINGS: Minimal scarring at the lung bases.  Diffuse wall thickening along the distal gastric body/antrum (series 2/ image 27), compatible with known gastric cancer.  Liver, spleen, pancreas, and adrenal glands are within normal limits.  Gallbladder is unremarkable. No intrahepatic or extrahepatic ductal dilatation.  3 mm nonobstructing interpolar right renal calculus (series 2/image 34). 8 mm lateral left lower pole renal cyst (series 3/image 18). No hydronephrosis.  Percutaneous jejunostomy tract with associated soft tissue/complex fluid along the left mid abdominal wall (series 2/ image 41). While this suggests infection, there is no drainable fluid collection/ abscess.  No evidence of bowel obstruction.  Atherosclerotic calcifications of the abdominal aorta and branch vessels.  No abdominopelvic ascites.  Upper abdominal/retroperitoneal lymphadenopathy, including:  --1.5 cm short axis gastrohepatic node (series 2/image 19), grossly unchanged  --1.3 cm short axis left para-aortic node (series 2/ image 23), grossly unchanged  Prostate is unremarkable.  Bladder is within normal limits.  Very mild degenerative changes of the visualized thoracolumbar spine.  IMPRESSION: Suspected infection/inflammation along the percutaneous jejunostomy tract in the left mid abdominal wall.  No drainable fluid collection/abscess.  Stable diffuse gastric wall thickening, compatible with known gastric cancer.  Stable upper abdominal/retroperitoneal nodal metastases.   Electronically Signed   By: SrJulian Hy.D.   On: 12/28/2013 11:42   Dg Abd  Acute W/chest  12/28/2013   CLINICAL DATA:  Pain, peg tube fell out.  EXAM: ACUTE ABDOMEN SERIES (ABDOMEN 2 VIEW & CHEST 1 VIEW)  COMPARISON:  DG CHEST 1V PORT dated 09/14/2013  FINDINGS: Cardiomediastinal silhouette is unremarkable. Lungs are clear, no pleural effusions. No pneumothorax. Single lumen left chest Port-A-Cath with distal tip projecting in the distal superior vena cava. Soft tissue planes and included osseous structures are unremarkable.  Bowel gas pattern is nondilated and nonobstructive. Moderate amount of retained large bowel stool. No intra-abdominal mass effect, pathologic calcifications or free air. Soft tissue planes and included osseous structures are nonsuspicious.  IMPRESSION: No acute cardiopulmonary process, stable appearance of the chest.  Moderate amount of retained large bowel stool without obstruction.   Electronically Signed   By: CoElon Alas On: 12/28/2013 06:57    Review of Systems  Constitutional: Negative for fever.  HENT: Negative.   Eyes: Negative.   Cardiovascular: Negative.   Gastrointestinal: Positive for nausea, vomiting, abdominal pain and constipation.  Genitourinary: Negative.   Musculoskeletal:       He hurts where ever you touch him.  Skin:       He has some cellulitis lateral   Neurological: Negative.   Endo/Heme/Allergies: Negative.   Psychiatric/Behavioral: The patient is nervous/anxious.    Blood pressure 128/83, pulse 112, temperature 98.8 F (37.1 C), temperature source Oral, resp. rate 18, SpO2 95.00%. Physical Exam  Constitutional: He appears well-developed. No distress.  Thin [place] in NAD.  HENT:  Head: Normocephalic and atraumatic.  Nose: Nose normal.  Eyes: Conjunctivae and EOM are normal. Pupils are equal,  round, and reactive to light. Right eye exhibits no discharge. Left eye exhibits no discharge. No scleral icterus.  Neck: Normal range of motion. Neck supple. No tracheal deviation present. No thyromegaly  present.  Cardiovascular: Normal rate and regular rhythm.   Respiratory: No respiratory distress. He has no wheezes. He has no rales. He exhibits tenderness.  Left port  GI: Soft. Bowel sounds are normal. He exhibits no distension and no mass. There is tenderness (HE doesn't like you touching him any place, abdomen or chest.). There is no rebound and no guarding.  He has some cellulitis away from the J tube site, nothing to warrant current WBC.  Musculoskeletal: Normal range of motion. He exhibits tenderness (all over). He exhibits no edema.  Lymphadenopathy:    He has no cervical adenopathy.  Neurological: He is alert. No cranial nerve deficit.  Skin: Skin is warm and dry. There is erythema (left side).  Psychiatric: He has a normal mood and affect. His behavior is normal. Thought content normal.    Assessment/Plan: 1.  Metastatic gastric cancer, stage IV-status post a diagnostic laparoscopy 08/05/2013 confirming carcinomatosis with biopsies of a transverse colon lymph node and small bowel mesentery nodule confirming metastatic poorly differentiated adenocarcinoma.  Lesser curvature gastric mass confirmed at endoscopy 07/12/2013 with a biopsy revealing poorly differentiated adenocarcinoma, currently on chemotherapy.  2.  He has lost his jejunostomy tube about 4 AM and this needs to be replaced ASAP 3.  Leukocytosis of uncertain etiology, on chemotherapy 4.  Malnutrition and deconditioning  Plan:  We have ask IR to see and replace jejunostomy tube.  Medicine should see and evaluate for leukocytosis on chemotherapy.  Nothing on CT or exam of abdomen would explain high WBC.  We will follow.  Dr. Excell Seltzer has discussed with Dr. Benay Spice and we found chemotherapy explains WBC.  It is Dr. Lear Ng opinion we can get the jejunostomy tube replaced by IR and send home on oral antibiotics.  Follow up in our office with Dr. Barry Dienes in 2 weeks, he is to follow up with Dr. Benay Spice also.  Earnstine Regal 12/28/2013, 12:36 PM

## 2013-12-28 NOTE — Telephone Encounter (Signed)
Call from pt requesting refill on Oxycodone elixir. He is being seen in ED to have GTube replaced, reports he will be sent home today. Reviewed with Lattie Haw, prescription received. Wife will pick up today.

## 2013-12-28 NOTE — Consult Note (Signed)
Patient interviewed and examined, imaging personally reviewed,  agree with PA note above. He will need his jejunostomy tube replaced immediately so that we do not lose the tract. We will ask radiology if they can do this under image guidance. He has some cellulitis around his jejunostomy tube but this does not appear severe and on not sure can't completely explain his markedly elevated white blood count. Due to recent chemotherapy and possible infection I would ask for a medicine evaluation and admission. There is no indication for surgical intervention at this time.  Edward Jolly MD, FACS  12/28/2013 1:30 PM

## 2013-12-28 NOTE — Discharge Instructions (Signed)
You have an infection around the site of your J tube. Please take the antibiotics as prescribed. Follow up with your oncologist in 1 week and your surgeon in 2 weeks.  Gastric Cancer  Gastric cancer is a tumor which starts as a growth in your stomach. Cancer is a group of many related diseases that begin in cells, the building blocks of the body. Normally, cells grow and divide to produce more cells only when the body needs them. Sometimes, cells keep dividing when new cells are not needed. These extra cells may form a mass of tissue called a growth or tumor. Tumors can be either benign (not cancerous) or malignant (cancerous). Cancer can begin in any organ or tissue of the body. The original tumor (where the tumor started out) is called the primary cancer and is usually named for where it begins.  Several types of cancer can occur in the stomach. Adenocarcinoma is the most common, accounting for about 95% of gastric tumors. Other cancer types include carcinoid tumors, lymphoma or gastrointestinal stromal cell tumors (GISTs).  CAUSES  Though the exact cause of gastric cancer is not known, there are several known risk factors:  Age over 34.  Male sex.  Race: more common in [place], [place], Hispanic and African American people.  Diet high in smoked, salted or pickled foods.  Tobacco and alcohol use.  History of stomach surgery, chronic gastritis, gastric polyps or pernicious anemia.  Stomach infection with H. pylori bacteria (which also increases risk for ulcers).  Genetic factors including family history and blood type A. Note that very few people with risk factors actually develop gastric cancer. SYMPTOMS   Pain.  Loss of appetite.  Problems swallowing.  Nausea and vomiting.  Vomiting blood.  Abdominal pain.  Excessive gas or belching.  Weight loss.  General health problems. DIAGNOSIS  Your caregiver may suspect gastric cancer based on your symptoms and your  physical exam. Further testing can diagnose gastric cancer. This may include looking for blood in your stool. Gastroscopy (looking at your stomach through an instrument like a thin flexible telescope; also called endoscopy) may also be done. Biopsies can be done if an abnormal growth is found. This is the removal of a small piece of tissue from your stomach if your caregiver notices abnormalities or growths there. The biopsy is looked at under a microscope by a specialist who can tell if cancer is present. If cancer is confirmed, other tests may be needed to see if the cancer has spread beyond the stomach. TREATMENT   Surgical removal of the stomach (gastrectomy) is the only curative treatment. Sometimes only part of the stomach needs to be removed, depending on location of the cancer. Gastrectomy can be done if the cancer is found before it has spread beyond the stomach.  Radiation therapy and chemotherapy may be helpful. Chemotherapy and radiation therapy given after surgery may improve cure rates or make you feel better.  Antibiotics are sometimes used for H.pylori infection.  Advanced techniques to remove or destroy cancer without surgery are being researched.  Feeding tubes or bypass surgery may help if food becomes blocked. The possibility of curing your gastric cancer depends on the type of tumor, the location of the tumor, and whether or not the tumor has spread beyond the stomach. If your cancer cannot be cured, treatment may slow the progression of the disease. Treatments are also available to address pain or other symptoms of cancer. HOME CARE INSTRUCTIONS   Your caregiver  may prescribe a specific type of diet. If you have had surgery, then a dietician may help you with an eating plan. Avoid red meats, processed meats, and salty, smoked or pickled foods.  Take any prescribed medications as directed. Do not use more pain medication than directed.  You do not need to limit your activity  unless instructed by your caregiver.  Avoid alcohol and tobacco use.  Keep appointments for tests and with your caregiver or specialists. SEEK MEDICAL CARE IF:   You have problems eating.  You have problems tolerating your medications.  You continue to lose weight despite your treatments. SEEK IMMEDIATE MEDICAL CARE IF:   You have uncontrolled nausea, vomiting or diarrhea.  You have vomiting with blood or coffee-grounds type material.  You have had chemotherapy and you have a fever.  You have uncontrolled pain. Document Released: 05/08/2004 Document Revised: 10/27/2011 Document Reviewed: 08/15/2008 [facility] Patient Information 2014 [place].

## 2013-12-29 ENCOUNTER — Telehealth: Payer: Self-pay | Admitting: *Deleted

## 2013-12-29 ENCOUNTER — Ambulatory Visit (HOSPITAL_BASED_OUTPATIENT_CLINIC_OR_DEPARTMENT_OTHER): Payer: BC Managed Care – PPO

## 2013-12-29 ENCOUNTER — Ambulatory Visit (HOSPITAL_BASED_OUTPATIENT_CLINIC_OR_DEPARTMENT_OTHER): Payer: BC Managed Care – PPO | Admitting: Nurse Practitioner

## 2013-12-29 ENCOUNTER — Telehealth: Payer: Self-pay | Admitting: Oncology

## 2013-12-29 VITALS — BP 118/77 | HR 100 | Temp 97.5°F | Resp 18 | Ht 63.0 in | Wt 107.9 lb

## 2013-12-29 DIAGNOSIS — C772 Secondary and unspecified malignant neoplasm of intra-abdominal lymph nodes: Secondary | ICD-10-CM

## 2013-12-29 DIAGNOSIS — E46 Unspecified protein-calorie malnutrition: Secondary | ICD-10-CM

## 2013-12-29 DIAGNOSIS — C165 Malignant neoplasm of lesser curvature of stomach, unspecified: Secondary | ICD-10-CM

## 2013-12-29 DIAGNOSIS — C169 Malignant neoplasm of stomach, unspecified: Secondary | ICD-10-CM

## 2013-12-29 DIAGNOSIS — Z5111 Encounter for antineoplastic chemotherapy: Secondary | ICD-10-CM

## 2013-12-29 DIAGNOSIS — Z5112 Encounter for antineoplastic immunotherapy: Secondary | ICD-10-CM

## 2013-12-29 DIAGNOSIS — R112 Nausea with vomiting, unspecified: Secondary | ICD-10-CM

## 2013-12-29 DIAGNOSIS — G893 Neoplasm related pain (acute) (chronic): Secondary | ICD-10-CM

## 2013-12-29 DIAGNOSIS — R630 Anorexia: Secondary | ICD-10-CM

## 2013-12-29 LAB — URINE CULTURE
COLONY COUNT: NO GROWTH
CULTURE: NO GROWTH

## 2013-12-29 LAB — COMPREHENSIVE METABOLIC PANEL (CC13)
ALT: 33 U/L (ref 0–55)
AST: 29 U/L (ref 5–34)
Albumin: 3 g/dL — ABNORMAL LOW (ref 3.5–5.0)
Alkaline Phosphatase: 118 U/L (ref 40–150)
Anion Gap: 11 mEq/L (ref 3–11)
BUN: 7.8 mg/dL (ref 7.0–26.0)
CO2: 26 mEq/L (ref 22–29)
CREATININE: 0.7 mg/dL (ref 0.7–1.3)
Calcium: 8.9 mg/dL (ref 8.4–10.4)
Chloride: 101 mEq/L (ref 98–109)
Glucose: 107 mg/dl (ref 70–140)
Potassium: 4.2 mEq/L (ref 3.5–5.1)
Sodium: 138 mEq/L (ref 136–145)
Total Protein: 6.6 g/dL (ref 6.4–8.3)

## 2013-12-29 LAB — CBC WITH DIFFERENTIAL/PLATELET
BASO%: 0.4 % (ref 0.0–2.0)
Basophils Absolute: 0.1 10*3/uL (ref 0.0–0.1)
EOS%: 2 % (ref 0.0–7.0)
Eosinophils Absolute: 0.6 10*3/uL — ABNORMAL HIGH (ref 0.0–0.5)
HCT: 34.6 % — ABNORMAL LOW (ref 38.4–49.9)
HGB: 11.7 g/dL — ABNORMAL LOW (ref 13.0–17.1)
LYMPH#: 3.1 10*3/uL (ref 0.9–3.3)
LYMPH%: 10 % — ABNORMAL LOW (ref 14.0–49.0)
MCH: 34.4 pg — ABNORMAL HIGH (ref 27.2–33.4)
MCHC: 33.8 g/dL (ref 32.0–36.0)
MCV: 101.9 fL — ABNORMAL HIGH (ref 79.3–98.0)
MONO#: 3.6 10*3/uL — ABNORMAL HIGH (ref 0.1–0.9)
MONO%: 11.8 % (ref 0.0–14.0)
NEUT#: 23.1 10*3/uL — ABNORMAL HIGH (ref 1.5–6.5)
NEUT%: 75.8 % — AB (ref 39.0–75.0)
Platelets: 429 10*3/uL — ABNORMAL HIGH (ref 140–400)
RBC: 3.4 10*6/uL — ABNORMAL LOW (ref 4.20–5.82)
RDW: 15.9 % — ABNORMAL HIGH (ref 11.0–14.6)
WBC: 30.6 10*3/uL — ABNORMAL HIGH (ref 4.0–10.3)

## 2013-12-29 MED ORDER — DIPHENHYDRAMINE HCL 25 MG PO CAPS
ORAL_CAPSULE | ORAL | Status: AC
  Filled 2013-12-29: qty 2

## 2013-12-29 MED ORDER — ACETAMINOPHEN 160 MG/5ML PO SOLN
650.0000 mg | Freq: Once | ORAL | Status: AC
  Administered 2013-12-29: 650 mg via ORAL
  Filled 2013-12-29: qty 20.3

## 2013-12-29 MED ORDER — DIPHENHYDRAMINE HCL 12.5 MG/5ML PO ELIX
50.0000 mg | ORAL_SOLUTION | Freq: Once | ORAL | Status: AC
  Administered 2013-12-29: 50 mg via ORAL
  Filled 2013-12-29: qty 20

## 2013-12-29 MED ORDER — DIPHENHYDRAMINE HCL 25 MG PO CAPS: 50.0000 mg | ORAL_CAPSULE | Freq: Once | ORAL | Status: DC

## 2013-12-29 MED ORDER — FLUOROURACIL CHEMO INJECTION 5 GM/100ML
2400.0000 mg/m2 | INTRAVENOUS | Status: DC
  Administered 2013-12-29: 3300 mg via INTRAVENOUS
  Filled 2013-12-29: qty 66

## 2013-12-29 MED ORDER — SODIUM CHLORIDE 0.9 % IV SOLN
4.0000 mg/kg | Freq: Once | INTRAVENOUS | Status: AC
  Administered 2013-12-29: 189 mg via INTRAVENOUS
  Filled 2013-12-29: qty 9

## 2013-12-29 MED ORDER — PALONOSETRON HCL INJECTION 0.25 MG/5ML
0.2500 mg | Freq: Once | INTRAVENOUS | Status: AC
  Administered 2013-12-29: 0.25 mg via INTRAVENOUS

## 2013-12-29 MED ORDER — OXALIPLATIN CHEMO INJECTION 100 MG/20ML
85.0000 mg/m2 | Freq: Once | INTRAVENOUS | Status: AC
  Administered 2013-12-29: 115 mg via INTRAVENOUS
  Filled 2013-12-29: qty 23

## 2013-12-29 MED ORDER — SODIUM CHLORIDE 0.9 % IV SOLN
Freq: Once | INTRAVENOUS | Status: AC
  Administered 2013-12-29: 11:00:00 via INTRAVENOUS

## 2013-12-29 MED ORDER — LEUCOVORIN CALCIUM INJECTION 350 MG
550.0000 mg | Freq: Once | INTRAVENOUS | Status: AC
  Administered 2013-12-29: 550 mg via INTRAVENOUS
  Filled 2013-12-29: qty 27.5

## 2013-12-29 MED ORDER — DEXAMETHASONE SODIUM PHOSPHATE 10 MG/ML IJ SOLN
INTRAMUSCULAR | Status: AC
  Filled 2013-12-29: qty 1

## 2013-12-29 MED ORDER — DEXTROSE 5 % IV SOLN
Freq: Once | INTRAVENOUS | Status: AC
  Administered 2013-12-29: 12:00:00 via INTRAVENOUS

## 2013-12-29 MED ORDER — ACETAMINOPHEN 325 MG PO TABS
ORAL_TABLET | ORAL | Status: AC
  Filled 2013-12-29: qty 2

## 2013-12-29 MED ORDER — SODIUM CHLORIDE 0.9 % IJ SOLN
10.0000 mL | INTRAMUSCULAR | Status: DC | PRN
  Administered 2013-12-29: 10 mL via INTRAVENOUS
  Filled 2013-12-29: qty 10

## 2013-12-29 MED ORDER — SODIUM CHLORIDE 0.9 % IV SOLN
150.0000 mg | Freq: Once | INTRAVENOUS | Status: AC
  Administered 2013-12-29: 150 mg via INTRAVENOUS
  Filled 2013-12-29: qty 5

## 2013-12-29 MED ORDER — PALONOSETRON HCL INJECTION 0.25 MG/5ML
INTRAVENOUS | Status: AC
  Filled 2013-12-29: qty 5

## 2013-12-29 MED ORDER — DEXAMETHASONE SODIUM PHOSPHATE 10 MG/ML IJ SOLN
10.0000 mg | Freq: Once | INTRAMUSCULAR | Status: AC
  Administered 2013-12-29: 10 mg via INTRAVENOUS

## 2013-12-29 MED ORDER — DEXAMETHASONE 1 MG/ML PO CONC: 5.0000 mg | Freq: Two times a day (BID) | ORAL | Status: DC

## 2013-12-29 MED ORDER — FLUOROURACIL CHEMO INJECTION 2.5 GM/50ML
400.0000 mg/m2 | Freq: Once | INTRAVENOUS | Status: AC
  Administered 2013-12-29: 550 mg via INTRAVENOUS
  Filled 2013-12-29: qty 11

## 2013-12-29 MED ORDER — ACETAMINOPHEN 325 MG PO TABS: 650.0000 mg | ORAL_TABLET | Freq: Once | ORAL | Status: DC

## 2013-12-29 NOTE — Patient Instructions (Signed)
[facility] Discharge Instructions for Patients Receiving Chemotherapy  Today you received the following chemotherapy agents: Herceptin, Oxaliplatin, Leucovorin, Adrucil (5FU)  To help prevent nausea and vomiting after your treatment, we encourage you to take your nausea medication: Zofran 4mg /3mL elixir: Place 5 mLs (4 mg total) into feeding tube every 8 (eight) hours as needed for nausea or vomiting.   If you develop nausea and vomiting that is not controlled by your nausea medication, call the clinic.   BELOW ARE SYMPTOMS THAT SHOULD BE REPORTED IMMEDIATELY:  *FEVER GREATER THAN 100.5 F  *CHILLS WITH OR WITHOUT FEVER  NAUSEA AND VOMITING THAT IS NOT CONTROLLED WITH YOUR NAUSEA MEDICATION  *UNUSUAL SHORTNESS OF BREATH  *UNUSUAL BRUISING OR BLEEDING  TENDERNESS IN MOUTH AND THROAT WITH OR WITHOUT PRESENCE OF ULCERS  *URINARY PROBLEMS  *BOWEL PROBLEMS  UNUSUAL RASH Items with * indicate a potential emergency and should be followed up as soon as possible.  Feel free to call the clinic you have any questions or concerns. The clinic phone number is [phone].

## 2013-12-29 NOTE — Telephone Encounter (Signed)
gv and prnted appt sched and avs for pt for May....emailed MW to add tx.

## 2013-12-29 NOTE — Progress Notes (Signed)
[place] OFFICE PROGRESS NOTE   Diagnosis:  Metastatic gastric cancer.  INTERVAL HISTORY:   Mr. Evan Peterson returns as scheduled. He completed cycle 4 FOLFOX/cycle 3 Herceptin on 12/15/2013. He developed nausea around day 3 with intermittent vomiting. He has periodic loose stools. No mouth sores. Cold sensitivity lasted approximately one week. No persistent neuropathy symptoms. He continues to have abdominal pain. He is taking pain medication every 6-8 hours. Feeding tube "fell out" yesterday and was replaced. Tube feedings continue to be his main source of nutrition.  Objective:  Vital signs in last 24 hours:  Blood pressure 118/77, pulse 100, temperature 97.5 F (36.4 C), temperature source Oral, resp. rate 18, height '5\' 3"'  (1.6 m), weight 107 lb 14.4 oz (48.943 kg), SpO2 100.00%.    HEENT: No thrush or ulcerations. Resp: Lungs clear. Cardio: Regular cardiac rhythm. GI: Diffusely tender with light touch. Marked guarding. He is able to palpate his abdomen with no tenderness. J-tube site with minimal erythema. Vascular: No leg edema. Neuro: Vibratory sense mildly to moderately decreased over the fingertips per tuning fork exam.   Port-A-Cath site is without erythema.    Lab Results:  Lab Results  Component Value Date   WBC 30.6* 12/29/2013   HGB 11.7* 12/29/2013   HCT 34.6* 12/29/2013   MCV 101.9* 12/29/2013   PLT 429* 12/29/2013   NEUTROABS 23.1* 12/29/2013    Imaging:  Ct Abdomen Pelvis W Contrast  12/28/2013   CLINICAL DATA:  Dislodged PEG tube, abdominal pain, leukocytosis, evaluate for PEG site infection/abscess  EXAM: CT ABDOMEN AND PELVIS WITH CONTRAST  TECHNIQUE: Multidetector CT imaging of the abdomen and pelvis was performed using the standard protocol following bolus administration of intravenous contrast.  CONTRAST:  71m OMNIPAQUE IOHEXOL 300 MG/ML SOLN, 843mOMNIPAQUE IOHEXOL 300 MG/ML SOLN  COMPARISON:  PET-CT dated 08/03/2013.  FINDINGS: Minimal  scarring at the lung bases.  Diffuse wall thickening along the distal gastric body/antrum (series 2/ image 27), compatible with known gastric cancer.  Liver, spleen, pancreas, and adrenal glands are within normal limits.  Gallbladder is unremarkable. No intrahepatic or extrahepatic ductal dilatation.  3 mm nonobstructing interpolar right renal calculus (series 2/image 34). 8 mm lateral left lower pole renal cyst (series 3/image 18). No hydronephrosis.  Percutaneous jejunostomy tract with associated soft tissue/complex fluid along the left mid abdominal wall (series 2/ image 41). While this suggests infection, there is no drainable fluid collection/ abscess.  No evidence of bowel obstruction.  Atherosclerotic calcifications of the abdominal aorta and branch vessels.  No abdominopelvic ascites.  Upper abdominal/retroperitoneal lymphadenopathy, including:  --1.5 cm short axis gastrohepatic node (series 2/image 19), grossly unchanged  --1.3 cm short axis left para-aortic node (series 2/ image 23), grossly unchanged  Prostate is unremarkable.  Bladder is within normal limits.  Very mild degenerative changes of the visualized thoracolumbar spine.  IMPRESSION: Suspected infection/inflammation along the percutaneous jejunostomy tract in the left mid abdominal wall.  No drainable fluid collection/abscess.  Stable diffuse gastric wall thickening, compatible with known gastric cancer.  Stable upper abdominal/retroperitoneal nodal metastases.   Electronically Signed   By: SrJulian Hy.D.   On: 12/28/2013 11:42   Ir Duoden/jejuno Tube Insert Percut W/fl Mod Sec  12/28/2013   CLINICAL DATA:  History of gastric carcinoma with indwelling jejunostomy tube. This tube has fallen out. The patient also has had recent significant pain and irritation at the jejunostomy exit site.  EXAM: JEJUNOSTOMY TUBE EXCHANGE  CONTRAST:  20 ml Omnipaque-300  FLUOROSCOPY  TIME:  2 min and 24 seconds.  PROCEDURE: The procedure, risks, benefits,  and alternatives were explained to the patient. Questions regarding the procedure were encouraged and answered. The patient understands and consents to the procedure.  The jejunostomy exit site of the left lower quadrant was prepped with Betadine in a sterile fashion, and a sterile drape was applied covering the operative field. Sterile gowns and sterile gloves were used for the procedure.  Viscous lidocaine was injected into the jejunostomy tract. 1% lidocaine was also infiltrated around the tube exit site. A 5 French catheter was advanced through the as a site. Contrast injection was performed. A Glidewire was advanced into the jejunum. Over this wire, a 55 French balloon retention jejunostomy catheter was advanced. The length of this catheter was cut short by approximately 8 cm. The retention balloon was advanced into the jejunal limb and gently inflated with 5 mL of saline. The tube was injected with contrast material. Final catheter position was confirmed with a fluoroscopic spot image obtained after injection of contrast.  COMPLICATIONS: None.  FINDINGS: A 5 French catheter was able to be easily advanced into the jejunal lumen. Due to extreme pain at the catheter exit site, a 27 French jejunostomy was placed. The original jejunostomy catheter was 20 [place]. The tip of the catheter lies well into the jejunum and the catheter is ready for use.  IMPRESSION: Replacement of dislodged tracheostomy with new 67 French balloon retention catheter. The tip lies in the jejunum.   Electronically Signed   By: Aletta Edouard M.D.   On: 12/28/2013 17:17   Dg Abd Acute W/chest  12/28/2013   CLINICAL DATA:  Pain, peg tube fell out.  EXAM: ACUTE ABDOMEN SERIES (ABDOMEN 2 VIEW & CHEST 1 VIEW)  COMPARISON:  DG CHEST 1V PORT dated 09/14/2013  FINDINGS: Cardiomediastinal silhouette is unremarkable. Lungs are clear, no pleural effusions. No pneumothorax. Single lumen left chest Port-A-Cath with distal tip projecting in the distal  superior vena cava. Soft tissue planes and included osseous structures are unremarkable.  Bowel gas pattern is nondilated and nonobstructive. Moderate amount of retained large bowel stool. No intra-abdominal mass effect, pathologic calcifications or free air. Soft tissue planes and included osseous structures are nonsuspicious.  IMPRESSION: No acute cardiopulmonary process, stable appearance of the chest.  Moderate amount of retained large bowel stool without obstruction.   Electronically Signed   By: Elon Alas   On: 12/28/2013 06:57    Medications: I have reviewed the patient's current medications.  Assessment/Plan: 1. Metastatic gastric cancer, stage IV. Status post diagnostic laparoscopy 08/05/2013 confirming carcinomatosis with biopsies of a transverse colon lymph node and small bowel mesentery nodule confirming metastatic poorly differentiated adenocarcinoma.  Lesser curvature gastric mass confirmed on endoscopy 07/12/2013 with a biopsy revealing poorly differentiated adenocarcinoma. HER-2 positive.  Cycle 1 FOLFOX 09/21/2013.  Cycle 2 FOLFOX/initiation of Herceptin 10/12/2013.  Treatment held on 10/27/2013 due to neutropenia.  Treatment held on 11/03/2013 per patient request.  Cycle 3 FOLFOX/cycle 2 Herceptin 11/17/2013. Cycle 4 FOLFOX/cycle 3 Herceptin 12/15/2013. CT abdomen/pelvis in the emergency department 12/28/2013. Stable diffuse gastric wall thickening and stable upper abdominal/retroperitoneal nodal metastases. Suspected infection/inflammation along the percutaneous jejunostomy tract in the left mid abdominal wall. 2. Anorexia/weight loss and malnutrition. Maintained on jejunostomy tube feedings. Weight is improved. 3. Pain secondary to gastric cancer. Currently taking liquid oxycodone (11m/5ml) 10 ml every 6 hours as needed.  4. Intermittent nausea and vomiting secondary to gastric cancer. He takes Zofran as needed.  5. Port-A-Cath placement by Dr. Barry Dienes 09/14/2013. 6. 2-D  echo 10/17/2013. Cavity size was normal. Systolic function was normal. Wall motion normal. No regional wall motion abnormalities. 7. Jejunostomy tube site infection 11/03/2013. He completed a course of Septra. The erythema and pain around the tube site resolved. He had recurrent erythema at the J-tube site 12/01/2013 and completed a course of doxycycline. CT 12/28/2013 showed suspected infection/inflammation along the percutaneous jejunostomy tract in the left mid abdominal wall. He is completing a another course of antibiotics. 8. Jejunostomy tube replaced 12/28/2013. 9. Delayed nausea following cycle 4 FOLFOX. He receives Aloxi and Emend. We will add prophylactic Decadron for 3 days beginning day 2 with cycle 5.   Disposition: He appears stable. He has completed 4 cycles of FOLFOX/3 cycles of Herceptin. CT scan done 12/28/2013 shows stable diffuse gastric wall thickening and stable upper abdominal/retroperitoneal nodal metastases.  Plan to proceed with cycle 5 of FOLFOX/cycle 3 Herceptin today as scheduled.  He had delayed nausea following cycle 4. He will begin prophylactic Decadron tomorrow.  He will return for a followup visit in 2 weeks. He will contact the office in the interim with any problems.  Plan reviewed with Dr. Benay Spice.    Owens Shark ANP/GNP-BC   12/29/2013  9:54 AM

## 2013-12-29 NOTE — Patient Instructions (Signed)

## 2013-12-29 NOTE — Progress Notes (Signed)
Per 12/29/13 Lattie Haw NP office note, okay to proceed with tx today with today's labs. Tylenol and Benadryl switched from pills to elixir.

## 2013-12-29 NOTE — Telephone Encounter (Signed)
Per staff message and POF I have scheduled appts.  JMW  

## 2013-12-31 ENCOUNTER — Ambulatory Visit (HOSPITAL_BASED_OUTPATIENT_CLINIC_OR_DEPARTMENT_OTHER): Payer: BC Managed Care – PPO

## 2013-12-31 VITALS — BP 130/69 | HR 98 | Temp 97.4°F

## 2013-12-31 DIAGNOSIS — C169 Malignant neoplasm of stomach, unspecified: Secondary | ICD-10-CM

## 2013-12-31 DIAGNOSIS — Z452 Encounter for adjustment and management of vascular access device: Secondary | ICD-10-CM

## 2013-12-31 MED ORDER — SODIUM CHLORIDE 0.9 % IJ SOLN
10.0000 mL | INTRAMUSCULAR | Status: DC | PRN
  Administered 2013-12-31: 10 mL
  Filled 2013-12-31: qty 10

## 2013-12-31 MED ORDER — HEPARIN SOD (PORK) LOCK FLUSH 100 UNIT/ML IV SOLN
500.0000 [IU] | Freq: Once | INTRAVENOUS | Status: AC | PRN
  Administered 2013-12-31: 500 [IU]
  Filled 2013-12-31: qty 5

## 2013-12-31 NOTE — ED Provider Notes (Signed)
Medical screening examination/treatment/procedure(s) were conducted as a shared visit with non-physician practitioner(s) and myself.  I personally evaluated the patient during the encounter.   EKG Interpretation None     Status post diagnosis of gastric carcinoma.   Feeding tube became dislodged.   Will obtain CT scan of abdomen and consult general surgery. No acute abdomen. Probable area of cellulitis at ostomy site  Nat Christen, MD 12/31/13 [phone]

## 2014-01-03 LAB — CULTURE, BLOOD (ROUTINE X 2)
Culture: NO GROWTH
Culture: NO GROWTH

## 2014-01-09 ENCOUNTER — Other Ambulatory Visit: Payer: Self-pay | Admitting: Oncology

## 2014-01-12 ENCOUNTER — Ambulatory Visit: Payer: BC Managed Care – PPO

## 2014-01-12 ENCOUNTER — Ambulatory Visit (HOSPITAL_BASED_OUTPATIENT_CLINIC_OR_DEPARTMENT_OTHER): Payer: BC Managed Care – PPO | Admitting: *Deleted

## 2014-01-12 ENCOUNTER — Telehealth: Payer: Self-pay | Admitting: Oncology

## 2014-01-12 ENCOUNTER — Ambulatory Visit (HOSPITAL_BASED_OUTPATIENT_CLINIC_OR_DEPARTMENT_OTHER): Payer: BC Managed Care – PPO | Admitting: Oncology

## 2014-01-12 VITALS — BP 117/75 | HR 92 | Temp 98.2°F | Resp 18 | Ht 63.0 in | Wt 110.6 lb

## 2014-01-12 DIAGNOSIS — Z95828 Presence of other vascular implants and grafts: Secondary | ICD-10-CM

## 2014-01-12 DIAGNOSIS — C169 Malignant neoplasm of stomach, unspecified: Secondary | ICD-10-CM

## 2014-01-12 LAB — CBC WITH DIFFERENTIAL/PLATELET
BASO%: 0.4 % (ref 0.0–2.0)
BASOS ABS: 0 10*3/uL (ref 0.0–0.1)
EOS%: 3.4 % (ref 0.0–7.0)
Eosinophils Absolute: 0.3 10*3/uL (ref 0.0–0.5)
HCT: 34.3 % — ABNORMAL LOW (ref 38.4–49.9)
HGB: 11.4 g/dL — ABNORMAL LOW (ref 13.0–17.1)
LYMPH%: 37.3 % (ref 14.0–49.0)
MCH: 33.9 pg — ABNORMAL HIGH (ref 27.2–33.4)
MCHC: 33.2 g/dL (ref 32.0–36.0)
MCV: 102.1 fL — AB (ref 79.3–98.0)
MONO#: 1.7 10*3/uL — ABNORMAL HIGH (ref 0.1–0.9)
MONO%: 22.4 % — AB (ref 0.0–14.0)
NEUT%: 36.5 % — ABNORMAL LOW (ref 39.0–75.0)
NEUTROS ABS: 2.8 10*3/uL (ref 1.5–6.5)
PLATELETS: 334 10*3/uL (ref 140–400)
RBC: 3.36 10*6/uL — ABNORMAL LOW (ref 4.20–5.82)
RDW: 15.7 % — ABNORMAL HIGH (ref 11.0–14.6)
WBC: 7.6 10*3/uL (ref 4.0–10.3)
lymph#: 2.8 10*3/uL (ref 0.9–3.3)

## 2014-01-12 LAB — COMPREHENSIVE METABOLIC PANEL (CC13)
ALBUMIN: 3.1 g/dL — AB (ref 3.5–5.0)
ALT: 36 U/L (ref 0–55)
AST: 32 U/L (ref 5–34)
Alkaline Phosphatase: 77 U/L (ref 40–150)
Anion Gap: 11 mEq/L (ref 3–11)
BUN: 7.9 mg/dL (ref 7.0–26.0)
CO2: 24 mEq/L (ref 22–29)
Calcium: 8.5 mg/dL (ref 8.4–10.4)
Chloride: 102 mEq/L (ref 98–109)
Creatinine: 0.7 mg/dL (ref 0.7–1.3)
GLUCOSE: 103 mg/dL (ref 70–140)
POTASSIUM: 4 meq/L (ref 3.5–5.1)
Sodium: 137 mEq/L (ref 136–145)
Total Protein: 6.7 g/dL (ref 6.4–8.3)

## 2014-01-12 MED ORDER — HEPARIN SOD (PORK) LOCK FLUSH 100 UNIT/ML IV SOLN
500.0000 [IU] | Freq: Once | INTRAVENOUS | Status: AC
  Administered 2014-01-12: 500 [IU] via INTRAVENOUS
  Filled 2014-01-12: qty 5

## 2014-01-12 MED ORDER — SODIUM CHLORIDE 0.9 % IJ SOLN
10.0000 mL | INTRAMUSCULAR | Status: DC | PRN
  Administered 2014-01-12: 10 mL via INTRAVENOUS
  Filled 2014-01-12: qty 10

## 2014-01-12 NOTE — Telephone Encounter (Signed)
gv and printed appt sched and avs for pt for June... °

## 2014-01-12 NOTE — Patient Instructions (Signed)

## 2014-01-12 NOTE — Progress Notes (Signed)
[place] OFFICE PROGRESS NOTE   Diagnosis: Gastric cancer  INTERVAL HISTORY:   He completed another cycle of FOLFOX 12/15/2013. He complains of nausea and vomiting following chemotherapy. He continues to have vomiting after liquids or solids by mouth. No vomiting with the tube feedings. The abdomen is tender. He reports persistent cold sensitivity. The jejunostomy tube came out on 12/28/2013. He was seen in the emergency room. The tube was placed and he was treated for cellulitis at the skin exit. Objective:  Vital signs in last 24 hours:  Blood pressure 117/75, pulse 92, temperature 98.2 F (36.8 C), temperature source Oral, resp. rate 18, height '5\' 3"'  (1.6 m), weight 110 lb 9.6 oz (50.168 kg).    HEENT: No thrush or ulcers Resp: Lungs clear bilaterally Cardio: Regular in rhythm GI: Mild diffuse tenderness, no hepatomegaly, mild erythema surrounding the skin exit at the jejunostomy tube site Vascular: No leg edema Neuro: Mild decrease in vibratory sense at the fingertips bilaterally    Portacath/PICC-without erythema  Lab Results:  Lab Results  Component Value Date   WBC 7.6 01/12/2014   HGB 11.4* 01/12/2014   HCT 34.3* 01/12/2014   MCV 102.1* 01/12/2014   PLT 334 01/12/2014   NEUTROABS 2.8 01/12/2014    Medications: I have reviewed the patient's current medications.  Assessment/Plan: 1. Metastatic gastric cancer, stage IV. Status post diagnostic laparoscopy 08/05/2013 confirming carcinomatosis with biopsies of a transverse colon lymph node and small bowel mesentery nodule confirming metastatic poorly differentiated adenocarcinoma.  Lesser curvature gastric mass confirmed on endoscopy 07/12/2013 with a biopsy revealing poorly differentiated adenocarcinoma. HER-2 positive.  Cycle 1 FOLFOX 09/21/2013.  Cycle 2 FOLFOX/initiation of Herceptin 10/12/2013.  Treatment held on 10/27/2013 due to neutropenia.  Treatment held on 11/03/2013 per patient request.    Cycle 3 FOLFOX/cycle 2 Herceptin 11/17/2013.  Cycle 4 FOLFOX/cycle 3 Herceptin 12/15/2013.  CT abdomen/pelvis in the emergency department 12/28/2013. Stable diffuse gastric wall thickening and stable upper abdominal/retroperitoneal nodal metastases. Suspected infection/inflammation along the percutaneous jejunostomy tract in the left mid abdominal wall. Cycle 5 FOLFOX 12/29/2013 2. Anorexia/weight loss and malnutrition. Maintained on jejunostomy tube feedings. Weight is improved. 3. Pain secondary to gastric cancer. Currently taking liquid oxycodone (36m/5ml) 10 ml every 6 hours as needed.  4. Intermittent nausea and vomiting secondary to gastric cancer. He takes Zofran as needed.  5. Port-A-Cath placement by Dr. BBarry Dienes01/28/2015. 6. 2-D echo 10/17/2013. Cavity size was normal. Systolic function was normal. Wall motion normal. No regional wall motion abnormalities. 7. Jejunostomy tube site infection 11/03/2013. He completed a course of Septra. The erythema and pain around the tube site resolved. He had recurrent erythema at the J-tube site 12/01/2013 and completed a course of doxycycline. CT 12/28/2013 showed suspected infection/inflammation along the percutaneous jejunostomy tract in the left mid abdominal wall. He completed  another course of antibiotics. 8. Jejunostomy tube replaced 12/28/2013.       9.   Delayed nausea following cycle 4 FOLFOX. He receives Aloxi and Emend. Prophylactic Decadron added for 3 days after cycle 5.       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 He is completed 5 cycles of FOLFOX. There is no clear evidence for disease progression. It is unclear whether the nausea is related to the gastric tumor or chemotherapy. Mr. NSedoredoes not wish to continue chemotherapy. We will follow him with observation.  He will return for an office visit and Port-A-Cath flush in one month.  Ladell Pier, MD  01/12/2014  11:24 AM

## 2014-01-22 DIAGNOSIS — K9423 Gastrostomy malfunction: Secondary | ICD-10-CM | POA: Insufficient documentation

## 2014-01-22 DIAGNOSIS — R110 Nausea: Secondary | ICD-10-CM | POA: Insufficient documentation

## 2014-01-22 DIAGNOSIS — Z87891 Personal history of nicotine dependence: Secondary | ICD-10-CM | POA: Insufficient documentation

## 2014-01-22 DIAGNOSIS — R109 Unspecified abdominal pain: Secondary | ICD-10-CM | POA: Insufficient documentation

## 2014-01-22 DIAGNOSIS — Z79899 Other long term (current) drug therapy: Secondary | ICD-10-CM | POA: Insufficient documentation

## 2014-01-22 DIAGNOSIS — Z85028 Personal history of other malignant neoplasm of stomach: Secondary | ICD-10-CM | POA: Insufficient documentation

## 2014-01-23 ENCOUNTER — Emergency Department (HOSPITAL_COMMUNITY)
Admission: EM | Admit: 2014-01-23 | Discharge: 2014-01-23 | Disposition: A | Discharge status: Home / Self Care | Payer: BC Managed Care – PPO | Attending: Emergency Medicine | Admitting: Emergency Medicine

## 2014-01-23 ENCOUNTER — Emergency Department (HOSPITAL_COMMUNITY): Payer: BC Managed Care – PPO

## 2014-01-23 ENCOUNTER — Encounter (HOSPITAL_COMMUNITY): Payer: Self-pay | Admitting: Emergency Medicine

## 2014-01-23 DIAGNOSIS — T85528A Displacement of other gastrointestinal prosthetic devices, implants and grafts, initial encounter: Secondary | ICD-10-CM

## 2014-01-23 MED ORDER — ONDANSETRON HCL 4 MG/2ML IJ SOLN
4.0000 mg | INTRAMUSCULAR | Status: AC
  Administered 2014-01-23: 4 mg via INTRAVENOUS
  Filled 2014-01-23: qty 2

## 2014-01-23 MED ORDER — MORPHINE SULFATE 4 MG/ML IJ SOLN
4.0000 mg | Freq: Once | INTRAMUSCULAR | Status: AC
  Administered 2014-01-23: 4 mg via INTRAVENOUS
  Filled 2014-01-23: qty 1

## 2014-01-23 MED ORDER — LIDOCAINE VISCOUS 2 % MT SOLN
OROMUCOSAL | Status: AC
  Administered 2014-01-23: 10 mL via OROMUCOSAL
  Filled 2014-01-23: qty 15

## 2014-01-23 MED ORDER — OXYCODONE HCL 5 MG/5ML PO SOLN: 10.0000 mg | ORAL | Status: DC | PRN

## 2014-01-23 MED ORDER — IOHEXOL 300 MG/ML  SOLN
10.0000 mL | Freq: Once | INTRAMUSCULAR | Status: AC | PRN
  Administered 2014-01-23: 10 mL

## 2014-01-23 MED ORDER — KCL IN DEXTROSE-NACL 20-5-0.45 MEQ/L-%-% IV SOLN
Freq: Once | INTRAVENOUS | Status: AC
  Administered 2014-01-23: 04:00:00 via INTRAVENOUS
  Filled 2014-01-23: qty 1000

## 2014-01-23 MED ORDER — LIDOCAINE VISCOUS 2 % MT SOLN
15.0000 mL | Freq: Once | OROMUCOSAL | Status: AC
  Administered 2014-01-23: 10 mL via OROMUCOSAL
  Filled 2014-01-23: qty 15

## 2014-01-23 MED ORDER — HEPARIN SOD (PORK) LOCK FLUSH 100 UNIT/ML IV SOLN
500.0000 [IU] | Freq: Once | INTRAVENOUS | Status: AC
  Administered 2014-01-23: 500 [IU]
  Filled 2014-01-23: qty 5

## 2014-01-23 NOTE — ED Provider Notes (Signed)
CSN: [id number]     Arrival date & time 01/22/14  2345 History   First MD Initiated Contact with Patient 01/23/14 0131     Chief Complaint  Patient presents with  . Feeding Tube Dislodgement     (Consider location/radiation/quality/duration/timing/severity/associated sxs/prior Treatment) HPI Comments: Patient is a 53 year old male with a history of stage IV gastric cancer who presents to the emergency today after his J-tube fell out. Patient states that he was cleaning the tube when the tube came loose. Patient was seen for similar complaint on 12/28/2013. Patient endorses some abdominal discomfort, but states that this is stable and usual for him. Symptoms associated with nausea which is also chronic. No vomiting or fevers. Patient states that the redness around his ostomy site is improved from his prior visit.  The history is provided by the patient. No language interpreter was used.    Past Medical History  Diagnosis Date  . Cancer     stomach   Past Surgical History  Procedure Laterality Date  . Upper gi endoscopy    . Peg placement N/A 08/05/2013    Procedure:  GASTROSTOMY (PEG) PLACEMENT;  Surgeon: Stark Klein, MD;  Location: [facility];  Service: General;  Laterality: N/A;  . Portacath placement Left 09/14/2013    Procedure: INSERTION PORT-A-CATH;  Surgeon: Stark Klein, MD;  Location: [facility];  Service: General;  Laterality: Left;  . Gastrectomy N/A 08/05/2013    Procedure: Laparoscopy Diagnostic  ;  Surgeon: Stark Klein, MD;  Location: [facility];  Service: General;  Laterality: N/A;   No family history on file. History  Substance Use Topics  . Smoking status: Former Smoker -- 0.50 packs/day for 35 years    Types: Cigarettes    Quit date: 08/05/2013  . Smokeless tobacco: Never Used  . Alcohol Use: No    Review of Systems  Constitutional: Negative for fever.  Gastrointestinal: Positive for nausea and abdominal pain (chronic). Negative for vomiting.       +J tube fell out   Skin: Negative for color change.  All other systems reviewed and are negative.    Allergies  Amoxicillin-pot clavulanate; [place]a; and Clarithromycin  Home Medications   Prior to Admission medications   Medication Sig Start Date End Date Taking? Authorizing Provider  docusate sodium (COLACE) 100 MG capsule Take 100 mg by mouth every other day.    Yes Historical Provider, MD  lidocaine-prilocaine (EMLA) cream Apply 1 application topically as needed. Apply to [facility] site 1-2 hours prior to stick and cover with plastic wrap to numb site 09/07/13  Yes Ladell Pier, MD  LORazepam (ATIVAN) 0.5 MG tablet Place 1 tablet (0.5 mg total) under the tongue every 8 (eight) hours as needed for anxiety (or nausea). 12/15/13  Yes Ladell Pier, MD  Nutritional Supplements (FEEDING SUPPLEMENT, OSMOLITE 1.5 CAL,) LIQD Change Osmolite 1.5 to 100 mL for 14 hours daily (6 cans) with 180 mL free water flush 5 times daily.  Order pump that will dispense 30 mL water q hour while continuous feedings are running.  This is patient's sole source of nutrition. 09/07/13  Yes Ladell Pier, MD  ondansetron [facility]) 4 MG/5ML solution Place 5 mLs (4 mg total) into feeding tube every 8 (eight) hours as needed for nausea or vomiting. 09/14/13  Yes Stark Klein, MD  oxyCODONE (ROXICODONE) 5 MG/5ML solution Take 10 mLs (10 mg total) by mouth every 4 (four) hours as needed for severe pain. 12/28/13  Yes Owens Shark, NP  Skin Protectants, Misc. (DIMETHICONE-ZINC OXIDE) cream Apply to g tube site 1-2x/day. 12/12/13  Yes Stark Klein, MD   BP 113/74  Pulse 92  Temp(Src) 98.8 F (37.1 C) (Oral)  Resp 18  SpO2 98%  Physical Exam  Nursing note and vitals reviewed. Constitutional: He is oriented to person, place, and time. He appears well-developed and well-nourished. No distress.  Nontoxic/nonseptic appearing.  HENT:  Head: Normocephalic and atraumatic.  Eyes: Conjunctivae and EOM are normal. No scleral icterus.  Neck: Normal  range of motion.  Cardiovascular: Normal rate, regular rhythm and normal heart sounds.   Pulmonary/Chest: Effort normal. No respiratory distress. He has no wheezes.  Chest expansion symmetric.  Abdominal: Soft. He exhibits no distension. There is tenderness. There is guarding (voluntary). There is no rebound.  TTP diffusely throughout the abdomen. Patient states this is usual for him. Voluntary guarding appreciated. No peritoneal signs. Ostomy site without heat to touch or red streaking. Site draining scant amount of serous drainage.  Musculoskeletal: Normal range of motion.  Neurological: He is alert and oriented to person, place, and time.  Skin: Skin is warm and dry. No rash noted. He is not diaphoretic. No erythema. No pallor.  Psychiatric: He has a normal mood and affect. His behavior is normal.    ED Course  Procedures (including critical care time) Labs Review Labs Reviewed - No data to display  Imaging Review No results found.   EKG Interpretation None      MDM   Final diagnoses:  Jejunostomy tube fell out    Patient presents to the emergency department for further evaluation of his J-tube ostomy site. Patient states that he was cleaning his J-tube when his J-tube fell out. He endorses abdominal pain and nausea which are chronic given stage IV gastric CA. No peritoneal signs. Ostomy site does not appear acutely infected; pink and draining serous fluid. No surrounding heat to touch or red streaking. No fevers. Will consult IR in AM for J-tube placement. Patient on D5-1/2 infusion. Pain controlled with IV morphine.  Patient seen and examined also by Dr. Elyn Peers who is in agreement with this assessment, workup, and management plan. Patient signed out to [facility], PA-C at shift change with J-tube placement pending.   Filed Vitals:   01/23/14 0013 01/23/14 0400 01/23/14 0500 01/23/14 0510  BP: 94/59   113/74  Pulse: 105 92 87 92  Temp: 98.8 F (37.1 C)      TempSrc: Oral     Resp: 18   18  SpO2: 97% 95% 96% 98%       Antonietta Breach, PA-C 01/23/14 [phone]

## 2014-01-23 NOTE — ED Notes (Signed)
Bed: MM76 Expected date:  Expected time:  Means of arrival:  Comments: Pt in IR for tube feeding placement

## 2014-01-23 NOTE — Procedures (Signed)
Interventional Radiology Procedure Note  Procedure:  Replace and upsize jejunal tube.   Complications: None immediate Recommendations: - Routine tube care  Signed,  Criselda Peaches, MD [facility]

## 2014-01-23 NOTE — ED Notes (Signed)
Patient waiting for interventional radiology to put in feeding tube.  Patient having upper abdominal pain that he rates as a 6.  Reports it is his pain from cancer.  Requesting pain medication.

## 2014-01-23 NOTE — Discharge Instructions (Signed)
Take oxycodone for breakthrough pain, do not drink alcohol, drive, care for children or do other critical tasks while taking oxycodone. ° °Please follow with your primary care doctor in the next 2 days for a check-up. They must obtain records for further management.  ° °Do not hesitate to return to the Emergency Department for any new, worsening or concerning symptoms.  ° °

## 2014-01-23 NOTE — ED Provider Notes (Signed)
PROGRESS NOTE                                                                                                                 This is a sign-out from [place] at shift change: Evan Peterson is a 53 y.o. male presenting with J tube displacement. Plan is to obtain a IR guided J-tube placement. Please refer to previous note for full HPI, ROS, PMH and PE.   J-tube replaced by interventional radiology. Pain medication refills at patient's request.  Filed Vitals:   01/23/14 1030 01/23/14 1045 01/23/14 1100 01/23/14 1241  BP: 113/72  104/72 125/77  Pulse: 80 79  89  Temp:    97.7 F (36.5 C)  TempSrc:      Resp:    20  SpO2: 97% 94%  94%    Medications  dextrose 5 % and 0.45 % NaCl with KCl 20 mEq/L infusion (0 mL/hr Intravenous Stopped 01/23/14 1254)  ondansetron (ZOFRAN) injection 4 mg (4 mg Intravenous Given 01/23/14 0335)  morphine 4 MG/ML injection 4 mg (4 mg Intravenous Given 01/23/14 0337)  morphine 4 MG/ML injection 4 mg (4 mg Intravenous Given 01/23/14 0927)  lidocaine (XYLOCAINE) 2 % viscous mouth solution 15 mL (10 mLs Mouth/Throat Given 01/23/14 1205)  iohexol (OMNIPAQUE) 300 MG/ML solution 10 mL (10 mLs Other Contrast Given 01/23/14 1230)  morphine 4 MG/ML injection 4 mg (4 mg Intravenous Given 01/23/14 1244)  heparin lock flush 100 unit/mL (500 Units Intracatheter Given 01/23/14 1253)    Evaluation does not show pathology that would require ongoing emergent intervention or inpatient treatment. Pt is hemodynamically stable and mentating appropriately. Discussed findings and plan with patient/guardian, who agrees with care plan. All questions answered. Return precautions discussed and outpatient follow up given.   Discharge Medication List as of 01/23/2014 12:37 PM    START taking these medications   Details  !! oxyCODONE (ROXICODONE) 5 MG/5ML solution Take 10 mLs (10 mg total) by mouth every 4 (four) hours as needed for severe pain., Starting 01/23/2014, Until Discontinued, Print     !! -  Potential duplicate medications found. Please discuss with provider.          Monico Blitz, PA-C 01/23/14 1625

## 2014-01-23 NOTE — ED Notes (Signed)
Per pt report: pt's feeding tube came out when pt was cleaning the tube. Pt a/o x 4.

## 2014-01-27 ENCOUNTER — Telehealth: Payer: Self-pay | Admitting: *Deleted

## 2014-01-27 DIAGNOSIS — C169 Malignant neoplasm of stomach, unspecified: Secondary | ICD-10-CM

## 2014-01-27 MED ORDER — OXYCODONE HCL 5 MG/5ML PO SOLN: 10.0000 mg | ORAL | Status: DC | PRN

## 2014-01-27 NOTE — Telephone Encounter (Signed)
Left VM needs refill on pain medication. Last filled 01/23/14 #250 ml by ER physician, which based on directions will only last 4 days. Next OV is 02/01/14

## 2014-01-27 NOTE — Telephone Encounter (Signed)
Have been providing since Feb 2015 feed/flush bags for his tube feedings. They are not reimbursed for this, so need documentation that he still requires these and can't tolerate bolus feeds. Requests last office note be faxed to them for dietician to review. Office note of 01/12/14 faxed.

## 2014-01-27 NOTE — Telephone Encounter (Signed)
Notified patient that script is ready for pick up. Attached appointment calendar to remind him of visit on 02/01/14.

## 2014-02-01 ENCOUNTER — Other Ambulatory Visit: Payer: Self-pay | Admitting: *Deleted

## 2014-02-01 ENCOUNTER — Ambulatory Visit (HOSPITAL_BASED_OUTPATIENT_CLINIC_OR_DEPARTMENT_OTHER): Payer: BC Managed Care – PPO | Admitting: Oncology

## 2014-02-01 ENCOUNTER — Telehealth: Payer: Self-pay | Admitting: Oncology

## 2014-02-01 VITALS — BP 121/68 | HR 104 | Temp 97.5°F | Resp 18 | Ht 63.0 in | Wt 105.2 lb

## 2014-02-01 DIAGNOSIS — G893 Neoplasm related pain (acute) (chronic): Secondary | ICD-10-CM

## 2014-02-01 DIAGNOSIS — C772 Secondary and unspecified malignant neoplasm of intra-abdominal lymph nodes: Secondary | ICD-10-CM

## 2014-02-01 DIAGNOSIS — R112 Nausea with vomiting, unspecified: Secondary | ICD-10-CM

## 2014-02-01 DIAGNOSIS — C165 Malignant neoplasm of lesser curvature of stomach, unspecified: Secondary | ICD-10-CM

## 2014-02-01 DIAGNOSIS — C169 Malignant neoplasm of stomach, unspecified: Secondary | ICD-10-CM

## 2014-02-01 MED ORDER — ONDANSETRON HCL 4 MG/5ML PO SOLN: 4.0000 mg | Freq: Three times a day (TID) | ORAL | Status: AC | PRN

## 2014-02-01 MED ORDER — FENTANYL 25 MCG/HR TD PT72: 25.0000 ug | MEDICATED_PATCH | TRANSDERMAL | Status: DC

## 2014-02-01 NOTE — Progress Notes (Signed)
[place] OFFICE PROGRESS NOTE   Diagnosis: Gastric cancer  INTERVAL HISTORY:   She returns as scheduled. He underwent replacement of the jejunal feeding tube in interventional radiology on 01/23/2014.  He continues to have abdominal pain. He takes oxycodone every 3-4 hours. He is concerned he may become addicted to the pain medicine. He has nausea and vomiting after eating. No nausea with the tube feedings. He complains of constipation.  Objective:  Vital signs in last 24 hours:  Blood pressure 121/68, pulse 104, temperature 97.5 F (36.4 C), temperature source Oral, resp. rate 18, height '5\' 3"'  (1.6 m), weight 105 lb 3.2 oz (47.718 kg), SpO2 100.00%.    HEENT:  The mucous membranes are moist, no thrush Resp:  Lungs clear bilaterally Cardio:  Regular rate and rhythm GI:  Left upper quadrant gastrostomy tube site with mild surrounding induration. Diffuse tenderness throughout the abdomen. No mass. Vascular:  No leg edema  Portacath/PICC-without erythema    Medications: I have reviewed the patient's current medications.  Assessment/Plan: 1. Metastatic gastric cancer, stage IV. Status post diagnostic laparoscopy 08/05/2013 confirming carcinomatosis with biopsies of a transverse colon lymph node and small bowel mesentery nodule confirming metastatic poorly differentiated adenocarcinoma.  Lesser curvature gastric mass confirmed on endoscopy 07/12/2013 with a biopsy revealing poorly differentiated adenocarcinoma. HER-2 positive.  Cycle 1 FOLFOX 09/21/2013.  Cycle 2 FOLFOX/initiation of Herceptin 10/12/2013.  Treatment held on 10/27/2013 due to neutropenia.  Treatment held on 11/03/2013 per patient request.  Cycle 3 FOLFOX/cycle 2 Herceptin 11/17/2013.  Cycle 4 FOLFOX/cycle 3 Herceptin 12/15/2013.  CT abdomen/pelvis in the emergency department 12/28/2013. Stable diffuse gastric wall thickening and stable upper abdominal/retroperitoneal nodal metastases. Suspected  infection/inflammation along the percutaneous jejunostomy tract in the left mid abdominal wall.  Cycle 5 FOLFOX 12/29/2013 2. Anorexia/weight loss and malnutrition. Maintained on jejunostomy tube feedings. Weight is improved. 3. Pain secondary to gastric cancer. Currently taking liquid oxycodone (40m/5ml) 10 ml every 3-4 hours 4. Intermittent nausea and vomiting secondary to gastric cancer.  5. Port-A-Cath placement by Dr. BBarry Dienes01/28/2015. 6. 2-D echo 10/17/2013. Cavity size was normal. Systolic function was normal. Wall motion normal. No regional wall motion abnormalities. 7. Jejunostomy tube site infection 11/03/2013. He completed a course of Septra. The erythema and pain around the tube site resolved. He had recurrent erythema at the J-tube site 12/01/2013 and completed a course of doxycycline. CT 12/28/2013 showed suspected infection/inflammation along the percutaneous jejunostomy tract in the left mid abdominal wall. He completed another course of antibiotics.       8.  Jejunostomy tube replaced 12/28/2013 and 62,013        9.  Delayed nausea following cycle 4 FOLFOX. He receives Aloxi and Emend. Prophylactic Decadron added for 3 days after cycle 5.        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 NGaydencontinues to have abdominal pain. He is taking oxycodone frequently. We added a Duragesic patch.  The nausea and vomiting is likely related to the gastric tumor and carcinomatosis. He is tolerating jejunostomy tube feedings.  I had a long discussion with him via an interpreter today. He understands no therapy will be curative. He decided against further chemotherapy when we saw him last month. We discussed chemotherapy again today. We will also discuss salvage chemotherapy when he returns in 2 weeks.  We added MiraLAX and he will increase the Colace for constipation.  He will return for an office visit and Port-A-Cath flush in 2 weeks. He  will contact us in the interim as  needed. Betsy Coder, MD  02/01/2014  1:03 PM

## 2014-02-01 NOTE — Telephone Encounter (Signed)
Gave pt appt for flush and MD for july 2015

## 2014-02-03 NOTE — ED Provider Notes (Signed)
Medical screening examination/treatment/procedure(s) were performed by non-physician practitioner and as supervising physician I was immediately available for consultation/collaboration.   EKG Interpretation None        Elyn Peers, MD 02/03/14 [phone]

## 2014-02-03 NOTE — ED Provider Notes (Signed)
Medical screening examination/treatment/procedure(s) were performed by non-physician practitioner and as supervising physician I was immediately available for consultation/collaboration.   EKG Interpretation None        Elyn Peers, MD 02/03/14 [phone]

## 2014-02-15 ENCOUNTER — Telehealth: Payer: Self-pay | Admitting: Nurse Practitioner

## 2014-02-15 ENCOUNTER — Ambulatory Visit (HOSPITAL_BASED_OUTPATIENT_CLINIC_OR_DEPARTMENT_OTHER): Payer: BC Managed Care – PPO | Admitting: Nurse Practitioner

## 2014-02-15 ENCOUNTER — Ambulatory Visit: Payer: BC Managed Care – PPO

## 2014-02-15 ENCOUNTER — Telehealth: Payer: Self-pay | Admitting: *Deleted

## 2014-02-15 VITALS — BP 109/79 | HR 99 | Temp 98.1°F | Resp 20 | Ht 63.0 in | Wt 103.6 lb

## 2014-02-15 DIAGNOSIS — R630 Anorexia: Secondary | ICD-10-CM

## 2014-02-15 DIAGNOSIS — G893 Neoplasm related pain (acute) (chronic): Secondary | ICD-10-CM

## 2014-02-15 DIAGNOSIS — R109 Unspecified abdominal pain: Secondary | ICD-10-CM

## 2014-02-15 DIAGNOSIS — C165 Malignant neoplasm of lesser curvature of stomach, unspecified: Secondary | ICD-10-CM

## 2014-02-15 DIAGNOSIS — E46 Unspecified protein-calorie malnutrition: Secondary | ICD-10-CM

## 2014-02-15 DIAGNOSIS — C772 Secondary and unspecified malignant neoplasm of intra-abdominal lymph nodes: Secondary | ICD-10-CM

## 2014-02-15 DIAGNOSIS — G622 Polyneuropathy due to other toxic agents: Secondary | ICD-10-CM

## 2014-02-15 DIAGNOSIS — R634 Abnormal weight loss: Secondary | ICD-10-CM

## 2014-02-15 DIAGNOSIS — R112 Nausea with vomiting, unspecified: Secondary | ICD-10-CM

## 2014-02-15 DIAGNOSIS — C169 Malignant neoplasm of stomach, unspecified: Secondary | ICD-10-CM

## 2014-02-15 DIAGNOSIS — T451X5A Adverse effect of antineoplastic and immunosuppressive drugs, initial encounter: Secondary | ICD-10-CM

## 2014-02-15 DIAGNOSIS — Z95828 Presence of other vascular implants and grafts: Secondary | ICD-10-CM

## 2014-02-15 DIAGNOSIS — C801 Malignant (primary) neoplasm, unspecified: Secondary | ICD-10-CM

## 2014-02-15 MED ORDER — OXYCODONE HCL 5 MG/5ML PO SOLN: 10.0000 mg | ORAL | Status: DC | PRN

## 2014-02-15 MED ORDER — FENTANYL 75 MCG/HR TD PT72: 75.0000 ug | MEDICATED_PATCH | TRANSDERMAL | Status: DC

## 2014-02-15 MED ORDER — HEPARIN SOD (PORK) LOCK FLUSH 100 UNIT/ML IV SOLN
500.0000 [IU] | Freq: Once | INTRAVENOUS | Status: AC
  Administered 2014-02-15: 500 [IU] via INTRAVENOUS
  Filled 2014-02-15: qty 5

## 2014-02-15 MED ORDER — SODIUM CHLORIDE 0.9 % IJ SOLN
10.0000 mL | INTRAMUSCULAR | Status: DC | PRN
  Administered 2014-02-15: 10 mL via INTRAVENOUS
  Filled 2014-02-15: qty 10

## 2014-02-15 NOTE — Telephone Encounter (Signed)
Per staff message and POF I have scheduled appts. Advised scheduler of appts. JMW  

## 2014-02-15 NOTE — Telephone Encounter (Signed)
, °

## 2014-02-15 NOTE — Patient Instructions (Signed)

## 2014-02-15 NOTE — Progress Notes (Addendum)
[facility]   Diagnosis:  Gastric cancer.  INTERVAL HISTORY:   He returns as scheduled. He reports poor pain control. He removed the Duragesic patch because he did not feel it was helping. He is taking oxycodone every 3 hours without relief. He is vomiting more frequently. The emesis is "dark". He denies any bright red blood. He continues the tube feedings. Bowels are moving.  Objective:  Vital signs in last 24 hours:  Blood pressure 109/79, pulse 99, temperature 98.1 F (36.7 C), temperature source Oral, resp. rate 20, height '5\' 3"'  (1.6 m), weight 103 lb 9.6 oz (46.993 kg).    HEENT: No thrush or ulcerations. Resp: Lungs clear. Cardio: Regular cardiac rhythm. GI: Abdomen is diffusely tender throughout. Left upper quadrant feeding tube site with mild surrounding erythema. Vascular: No leg edema.  Port-A-Cath site without erythema.    Lab Results:  Lab Results  Component Value Date   WBC 7.6 01/12/2014   HGB 11.4* 01/12/2014   HCT 34.3* 01/12/2014   MCV 102.1* 01/12/2014   PLT 334 01/12/2014   NEUTROABS 2.8 01/12/2014    Imaging:  No results found.  Medications: I have reviewed the patient's current medications.  Assessment/Plan: 1. Metastatic gastric cancer, stage IV. Status post diagnostic laparoscopy 08/05/2013 confirming carcinomatosis with biopsies of a transverse colon lymph node and small bowel mesentery nodule confirming metastatic poorly differentiated adenocarcinoma.  Lesser curvature gastric mass confirmed on endoscopy 07/12/2013 with a biopsy revealing poorly differentiated adenocarcinoma. HER-2 positive.  Cycle 1 FOLFOX 09/21/2013.  Cycle 2 FOLFOX/initiation of Herceptin 10/12/2013.  Treatment held on 10/27/2013 due to neutropenia.  Treatment held on 11/03/2013 per patient request.  Cycle 3 FOLFOX/cycle 2 Herceptin 11/17/2013.  Cycle 4 FOLFOX/cycle 3 Herceptin 12/15/2013.  CT abdomen/pelvis in the emergency department  12/28/2013. Stable diffuse gastric wall thickening and stable upper abdominal/retroperitoneal nodal metastases. Suspected infection/inflammation along the percutaneous jejunostomy tract in the left mid abdominal wall.  Cycle 5 FOLFOX 12/29/2013. 2. Anorexia/weight loss and malnutrition. Maintained on jejunostomy tube feedings.  3. Pain secondary to gastric cancer. Currently taking liquid oxycodone (33m/5ml) 10 ml every 3 hours 4. Intermittent nausea and vomiting secondary to gastric cancer.  5. Port-A-Cath placement by Dr. BBarry Dienes01/28/2015. 6. 2-D echo 10/17/2013. Cavity size was normal. Systolic function was normal. Wall motion normal. No regional wall motion abnormalities. 7. Jejunostomy tube site infection 11/03/2013. He completed a course of Septra. The erythema and pain around the tube site resolved. He had recurrent erythema at the J-tube site 12/01/2013 and completed a course of doxycycline. CT 12/28/2013 showed suspected infection/inflammation along the percutaneous jejunostomy tract in the left mid abdominal wall. He completed another course of antibiotics. 8. Jejunostomy tube replaced 12/28/2013 and 01/23/2014. 9. Delayed nausea following cycle 4 FOLFOX. He received Aloxi, Emend and prophylactic Decadron. 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  Disposition: Mr. NBoscheeis having increased abdominal pain and experiencing nausea/vomiting. He understands the symptoms are likely related to the gastric tumor and carcinomatosis. We increased the Duragesic patch to 75 mcg every 3 days and increased the oxycodone to 10-20 mg every 4 hours as needed.  We discussed salvage chemotherapy versus a supportive care approach with a hospice referral. He indicates that he would like to proceed with a trial of chemotherapy. He understands no therapy will be curative.  Dr. SBenay Spicerecommends Taxotere on a weekly schedule. We reviewed potential toxicities including myelosuppression, nausea, mouth sores, diarrhea,  hair loss, allergic reaction, fluid retention, neuropathy, tearing.  He would  like to return to begin Taxotere on 02/27/2014. We will see him in followup prior to proceeding with treatment that day. He understands to contact the office in the interim with any problems.  An interpreter was present throughout today's visit.   Patient seen with Dr. Benay Spice. 25 minutes were spent face-to-face at today's visit with the majority of the time involved in counseling/coordination of care.  Ned Card ANP/GNP-BC   02/15/2014  10:49 AM  this was a shared visit with Ned Card.  He was interviewed and examined. He understands no therapy will be curative and there is a small chance of clinical benefit with further chemotherapy. He would like to begin a trial of salvage chemotherapy after the week of 02/20/2014. We will see him for an office visit on 02/27/2014. If his pain is under better control and his performance status has improved we will begin a trial of weekly Taxotere.  Julieanne Manson, M.D.

## 2014-02-15 NOTE — Patient Instructions (Signed)
Docetaxel injection  What is this medicine?  DOCETAXEL (doe se TAX el) is a chemotherapy drug. It targets fast dividing cells, like cancer cells, and causes these cells to die. This medicine is used to treat many types of cancers like breast cancer, certain stomach cancers, head and neck cancer, lung cancer, and prostate cancer.  This medicine may be used for other purposes; ask your health care provider or pharmacist if you have questions.  COMMON BRAND NAME(S): Docefrez, Taxotere  What should I tell my health care provider before I take this medicine?  They need to know if you have any of these conditions:  -infection (especially a virus infection such as chickenpox, cold sores, or herpes)  -liver disease  -low blood counts, like low white cell, platelet, or red cell counts  -an unusual or allergic reaction to docetaxel, polysorbate 80, other chemotherapy agents, other medicines, foods, dyes, or preservatives  -pregnant or trying to get pregnant  -breast-feeding  How should I use this medicine?  This drug is given as an infusion into a vein. It is administered in a hospital or clinic by a specially trained health care professional.  Talk to your pediatrician regarding the use of this medicine in children. Special care may be needed.  Overdosage: If you think you have taken too much of this medicine contact a poison control center or emergency room at once.  NOTE: This medicine is only for you. Do not share this medicine with others.  What if I miss a dose?  It is important not to miss your dose. Call your doctor or health care professional if you are unable to keep an appointment.  What may interact with this medicine?  -cyclosporine  -erythromycin  -ketoconazole  -medicines to increase blood counts like filgrastim, pegfilgrastim, sargramostim  -vaccines  Talk to your doctor or health care professional before taking any of these medicines:  -acetaminophen  -aspirin  -ibuprofen  -ketoprofen  -naproxen  This list  may not describe all possible interactions. Give your health care provider a list of all the medicines, herbs, non-prescription drugs, or dietary supplements you use. Also tell them if you smoke, drink alcohol, or use illegal drugs. Some items may interact with your medicine.  What should I watch for while using this medicine?  Your condition will be monitored carefully while you are receiving this medicine. You will need important blood work done while you are taking this medicine.  This drug may make you feel generally unwell. This is not uncommon, as chemotherapy can affect healthy cells as well as cancer cells. Report any side effects. Continue your course of treatment even though you feel ill unless your doctor tells you to stop.  In some cases, you may be given additional medicines to help with side effects. Follow all directions for their use.  Call your doctor or health care professional for advice if you get a fever, chills or sore throat, or other symptoms of a cold or flu. Do not treat yourself. This drug decreases your body's ability to fight infections. Try to avoid being around people who are sick.  This medicine may increase your risk to bruise or bleed. Call your doctor or health care professional if you notice any unusual bleeding.  Be careful brushing and flossing your teeth or using a toothpick because you may get an infection or bleed more easily. If you have any dental work done, tell your dentist you are receiving this medicine.  Avoid taking products that   contain aspirin, acetaminophen, ibuprofen, naproxen, or ketoprofen unless instructed by your doctor. These medicines may hide a fever.  This medicine contains an alcohol in the product. You may get drowsy or dizzy. Do not drive, use machinery, or do anything that needs mental alertness until you know how this medicine affects you. Do not stand or sit up quickly, especially if you are an older patient. This reduces the risk of dizzy or  fainting spells. Avoid alcoholic drinks  Do not become pregnant while taking this medicine. Women should inform their doctor if they wish to become pregnant or think they might be pregnant. There is a potential for serious side effects to an unborn child. Talk to your health care professional or pharmacist for more information. Do not breast-feed an infant while taking this medicine.  What side effects may I notice from receiving this medicine?  Side effects that you should report to your doctor or health care professional as soon as possible:  -allergic reactions like skin rash, itching or hives, swelling of the face, lips, or tongue  -low blood counts - This drug may decrease the number of white blood cells, red blood cells and platelets. You may be at increased risk for infections and bleeding.  -signs of infection - fever or chills, cough, sore throat, pain or difficulty passing urine  -signs of decreased platelets or bleeding - bruising, pinpoint red spots on the skin, black, tarry stools, nosebleeds  -signs of decreased red blood cells - unusually weak or tired, fainting spells, lightheadedness  -breathing problems  -fast or irregular heartbeat  -low blood pressure  -mouth sores  -nausea and vomiting  -pain, swelling, redness or irritation at the injection site  -pain, tingling, numbness in the hands or feet  -swelling of the ankle, feet, hands  -weight gain  Side effects that usually do not require medical attention (report to your prescriber or health care professional if they continue or are bothersome):  -bone pain  -complete hair loss including hair on your head, underarms, pubic hair, eyebrows, and eyelashes  -diarrhea  -excessive tearing  -changes in the color of fingernails  -loosening of the fingernails  -nausea  -muscle pain  -red flush to skin  -sweating  -weak or tired  This list may not describe all possible side effects. Call your doctor for medical advice about side effects. You may report side  effects to FDA at 1-800-FDA-1088.  Where should I keep my medicine?  This drug is given in a hospital or clinic and will not be stored at home.  NOTE: This sheet is a summary. It may not cover all possible information. If you have questions about this medicine, talk to your doctor, pharmacist, or health care provider.   2015, [employer]. (2013-06-30 22:21:02)

## 2014-02-16 ENCOUNTER — Encounter: Payer: Self-pay | Admitting: Oncology

## 2014-02-16 NOTE — Progress Notes (Signed)
Put disability form on nurse's desk. °

## 2014-02-22 ENCOUNTER — Encounter: Payer: Self-pay | Admitting: Oncology

## 2014-02-22 NOTE — Progress Notes (Signed)
Faxed disability form to [employer] @ [phone]

## 2014-02-24 ENCOUNTER — Other Ambulatory Visit: Payer: Self-pay | Admitting: *Deleted

## 2014-02-24 DIAGNOSIS — C169 Malignant neoplasm of stomach, unspecified: Secondary | ICD-10-CM

## 2014-02-24 MED ORDER — OXYCODONE HCL 5 MG/5ML PO SOLN: 10.0000 mg | ORAL | Status: DC | PRN

## 2014-02-24 NOTE — Telephone Encounter (Signed)
Message from pt requesting refill on pain meds. Rx will be left in prescription book for pick up.

## 2014-02-27 ENCOUNTER — Other Ambulatory Visit: Payer: Self-pay | Admitting: Oncology

## 2014-02-27 ENCOUNTER — Telehealth: Payer: Self-pay | Admitting: Nurse Practitioner

## 2014-02-27 ENCOUNTER — Ambulatory Visit (HOSPITAL_BASED_OUTPATIENT_CLINIC_OR_DEPARTMENT_OTHER): Payer: BC Managed Care – PPO | Admitting: Nurse Practitioner

## 2014-02-27 ENCOUNTER — Ambulatory Visit (HOSPITAL_BASED_OUTPATIENT_CLINIC_OR_DEPARTMENT_OTHER): Payer: BC Managed Care – PPO

## 2014-02-27 ENCOUNTER — Other Ambulatory Visit (HOSPITAL_BASED_OUTPATIENT_CLINIC_OR_DEPARTMENT_OTHER): Payer: BC Managed Care – PPO

## 2014-02-27 ENCOUNTER — Telehealth: Payer: Self-pay | Admitting: *Deleted

## 2014-02-27 VITALS — BP 106/70 | HR 98 | Temp 97.9°F | Resp 18 | Ht 63.0 in | Wt 100.5 lb

## 2014-02-27 VITALS — BP 111/71 | HR 89 | Temp 98.8°F | Resp 16

## 2014-02-27 DIAGNOSIS — C772 Secondary and unspecified malignant neoplasm of intra-abdominal lymph nodes: Secondary | ICD-10-CM

## 2014-02-27 DIAGNOSIS — C165 Malignant neoplasm of lesser curvature of stomach, unspecified: Secondary | ICD-10-CM

## 2014-02-27 DIAGNOSIS — G620 Drug-induced polyneuropathy: Secondary | ICD-10-CM

## 2014-02-27 DIAGNOSIS — Z95828 Presence of other vascular implants and grafts: Secondary | ICD-10-CM

## 2014-02-27 DIAGNOSIS — C169 Malignant neoplasm of stomach, unspecified: Secondary | ICD-10-CM

## 2014-02-27 DIAGNOSIS — Z5111 Encounter for antineoplastic chemotherapy: Secondary | ICD-10-CM

## 2014-02-27 DIAGNOSIS — G893 Neoplasm related pain (acute) (chronic): Secondary | ICD-10-CM

## 2014-02-27 LAB — COMPREHENSIVE METABOLIC PANEL (CC13)
ALT: 31 U/L (ref 0–55)
ANION GAP: 11 meq/L (ref 3–11)
AST: 42 U/L — ABNORMAL HIGH (ref 5–34)
Albumin: 3.2 g/dL — ABNORMAL LOW (ref 3.5–5.0)
Alkaline Phosphatase: 65 U/L (ref 40–150)
BUN: 12.5 mg/dL (ref 7.0–26.0)
CHLORIDE: 97 meq/L — AB (ref 98–109)
CO2: 27 meq/L (ref 22–29)
CREATININE: 0.8 mg/dL (ref 0.7–1.3)
Calcium: 9.3 mg/dL (ref 8.4–10.4)
GLUCOSE: 112 mg/dL (ref 70–140)
POTASSIUM: 4.3 meq/L (ref 3.5–5.1)
Sodium: 135 mEq/L — ABNORMAL LOW (ref 136–145)
Total Bilirubin: 0.31 mg/dL (ref 0.20–1.20)
Total Protein: 7.6 g/dL (ref 6.4–8.3)

## 2014-02-27 LAB — CBC WITH DIFFERENTIAL/PLATELET
BASO%: 0.2 % (ref 0.0–2.0)
BASOS ABS: 0 10*3/uL (ref 0.0–0.1)
EOS ABS: 0 10*3/uL (ref 0.0–0.5)
EOS%: 0.4 % (ref 0.0–7.0)
HEMATOCRIT: 40.4 % (ref 38.4–49.9)
HEMOGLOBIN: 13.4 g/dL (ref 13.0–17.1)
LYMPH#: 1.4 10*3/uL (ref 0.9–3.3)
LYMPH%: 17.2 % (ref 14.0–49.0)
MCH: 33.4 pg (ref 27.2–33.4)
MCHC: 33.2 g/dL (ref 32.0–36.0)
MCV: 100.5 fL — ABNORMAL HIGH (ref 79.3–98.0)
MONO#: 0.9 10*3/uL (ref 0.1–0.9)
MONO%: 11.2 % (ref 0.0–14.0)
NEUT%: 71 % (ref 39.0–75.0)
NEUTROS ABS: 5.7 10*3/uL (ref 1.5–6.5)
Platelets: 489 10*3/uL — ABNORMAL HIGH (ref 140–400)
RBC: 4.02 10*6/uL — ABNORMAL LOW (ref 4.20–5.82)
RDW: 15.1 % — AB (ref 11.0–14.6)
WBC: 8 10*3/uL (ref 4.0–10.3)

## 2014-02-27 MED ORDER — DOCETAXEL CHEMO INJECTION 160 MG/16ML
35.0000 mg | Freq: Once | INTRAVENOUS | Status: AC
  Administered 2014-02-27: 35 mg via INTRAVENOUS
  Filled 2014-02-27: qty 3.5

## 2014-02-27 MED ORDER — DEXAMETHASONE SODIUM PHOSPHATE 10 MG/ML IJ SOLN
INTRAMUSCULAR | Status: AC
  Filled 2014-02-27: qty 1

## 2014-02-27 MED ORDER — DEXAMETHASONE SODIUM PHOSPHATE 10 MG/ML IJ SOLN
10.0000 mg | Freq: Once | INTRAMUSCULAR | Status: AC
  Administered 2014-02-27: 10 mg via INTRAVENOUS

## 2014-02-27 MED ORDER — ONDANSETRON 8 MG/NS 50 ML IVPB
INTRAVENOUS | Status: AC
Start: 2014-02-27 — End: 2014-02-27
  Filled 2014-02-27: qty 8

## 2014-02-27 MED ORDER — SODIUM CHLORIDE 0.9 % IJ SOLN
10.0000 mL | INTRAMUSCULAR | Status: DC | PRN
  Administered 2014-02-27: 10 mL via INTRAVENOUS
  Filled 2014-02-27: qty 10

## 2014-02-27 MED ORDER — HEPARIN SOD (PORK) LOCK FLUSH 100 UNIT/ML IV SOLN
500.0000 [IU] | Freq: Once | INTRAVENOUS | Status: AC | PRN
  Administered 2014-02-27: 500 [IU]
  Filled 2014-02-27: qty 5

## 2014-02-27 MED ORDER — ONDANSETRON 8 MG/50ML IVPB (CHCC)
8.0000 mg | Freq: Once | INTRAVENOUS | Status: AC
Start: 2014-02-27 — End: 2014-02-27
  Administered 2014-02-27: 8 mg via INTRAVENOUS

## 2014-02-27 MED ORDER — SODIUM CHLORIDE 0.9 % IV SOLN
Freq: Once | INTRAVENOUS | Status: AC
  Administered 2014-02-27: 14:00:00 via INTRAVENOUS

## 2014-02-27 MED ORDER — SODIUM CHLORIDE 0.9 % IJ SOLN
10.0000 mL | INTRAMUSCULAR | Status: DC | PRN
  Administered 2014-02-27: 10 mL
  Filled 2014-02-27: qty 10

## 2014-02-27 NOTE — Telephone Encounter (Signed)
Confirmed labs/ov per 07/13 POF gave pt AVS and sent chemo schedule to [facility]...Marland KitchenMarland KitchenKJ

## 2014-02-27 NOTE — Patient Instructions (Signed)

## 2014-02-27 NOTE — Patient Instructions (Signed)
[facility] Discharge Instructions for Patients Receiving Chemotherapy  Today you received the following chemotherapy agents: Taxotere  To help prevent nausea and vomiting after your treatment, we encourage you to take your nausea medication as prescribed.    If you develop nausea and vomiting that is not controlled by your nausea medication, call the clinic.   BELOW ARE SYMPTOMS THAT SHOULD BE REPORTED IMMEDIATELY:  *FEVER GREATER THAN 100.5 F  *CHILLS WITH OR WITHOUT FEVER  NAUSEA AND VOMITING THAT IS NOT CONTROLLED WITH YOUR NAUSEA MEDICATION  *UNUSUAL SHORTNESS OF BREATH  *UNUSUAL BRUISING OR BLEEDING  TENDERNESS IN MOUTH AND THROAT WITH OR WITHOUT PRESENCE OF ULCERS  *URINARY PROBLEMS  *BOWEL PROBLEMS  UNUSUAL RASH Items with * indicate a potential emergency and should be followed up as soon as possible.  Feel free to call the clinic you have any questions or concerns. The clinic phone number is [phone].    

## 2014-02-27 NOTE — Progress Notes (Addendum)
[facility] OFFICE PROGRESS NOTE   Diagnosis:  Gastric cancer.  INTERVAL HISTORY:   Evan Peterson returns as scheduled. He reports improved pain control on the higher dose of Duragesic patch and oxycodone. He continues to have intermittent vomiting and describes the emesis is "dark". He continues G-tube feedings. He reports his bowels are moving. He has developed a pruritic skin rash over the trunk and extremities since his last visit. He is on no new medications.  Objective:  Vital signs in last 24 hours:  Blood pressure 106/70, pulse 98, temperature 97.9 F (36.6 C), temperature source Oral, resp. rate 18, height '5\' 3"'  (1.6 m), weight 100 lb 8 oz (45.587 kg), SpO2 99.00%.    HEENT: No thrush or ulcerations. Resp: Lungs clear. Cardio: Regular cardiac rhythm. GI: Abdomen was diffuse tenderness. Left upper quadrant feeding tube site without signs of infection. Vascular: No leg edema.  Skin: Erythematous, slightly raised skin rash scattered over the trunk and extremities. He also has a few lesions on his forehead. Rash is dry appearing in some areas.   Lab Results:  Lab Results  Component Value Date   WBC 8.0 02/27/2014   HGB 13.4 02/27/2014   HCT 40.4 02/27/2014   MCV 100.5* 02/27/2014   PLT 489* 02/27/2014   NEUTROABS 5.7 02/27/2014    Imaging:  No results found.  Medications: I have reviewed the patient's current medications.  Assessment/Plan: 1. Metastatic gastric cancer, stage IV. Status post diagnostic laparoscopy 08/05/2013 confirming carcinomatosis with biopsies of a transverse colon lymph node and small bowel mesentery nodule confirming metastatic poorly differentiated adenocarcinoma.  Lesser curvature gastric mass confirmed on endoscopy 07/12/2013 with a biopsy revealing poorly differentiated adenocarcinoma. HER-2 positive.  Cycle 1 FOLFOX 09/21/2013.  Cycle 2 FOLFOX/initiation of Herceptin 10/12/2013.  Treatment held on 10/27/2013 due to neutropenia.    Treatment held on 11/03/2013 per patient request.  Cycle 3 FOLFOX/cycle 2 Herceptin 11/17/2013.  Cycle 4 FOLFOX/cycle 3 Herceptin 12/15/2013.  CT abdomen/pelvis in the emergency department 12/28/2013. Stable diffuse gastric wall thickening and stable upper abdominal/retroperitoneal nodal metastases. Suspected infection/inflammation along the percutaneous jejunostomy tract in the left mid abdominal wall.  Cycle 5 FOLFOX 12/29/2013. 2. Anorexia/weight loss and malnutrition. Maintained on jejunostomy tube feedings.  3. Pain secondary to gastric cancer. Currently on a Duragesic patch 75 mcg every 3 days and oxycodone 10-20 mg every 4 hours as needed. 4. Intermittent nausea and vomiting secondary to gastric cancer.  5. Port-A-Cath placement by Dr. Barry Dienes 09/14/2013. 6. 2-D echo 10/17/2013. Cavity size was normal. Systolic function was normal. Wall motion normal. No regional wall motion abnormalities. 7. Jejunostomy tube site infection 11/03/2013. He completed a course of Septra. The erythema and pain around the tube site resolved. He had recurrent erythema at the J-tube site 12/01/2013 and completed a course of doxycycline. CT 12/28/2013 showed suspected infection/inflammation along the percutaneous jejunostomy tract in the left mid abdominal wall. He completed another course of antibiotics. 8. Jejunostomy tube replaced 12/28/2013 and 01/23/2014. 9. Delayed nausea following cycle 4 FOLFOX. He received Aloxi, Emend and prophylactic Decadron. 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 11. Rash.   Disposition: Evan Peterson continues to have a poor performance status. Pain control is better on the increased Duragesic patch. He would like to proceed with weekly Taxotere as discussed at his last visit. We again reviewed potential toxicities. The Taxotere will be given weekly x3 followed by a one-week break.  He has a skin rash of unclear etiology, question allergic. He will receive a dose  of dexamethasone today as  part of the premedication regimen. He will try Benadryl over-the-counter at home for the pruritus. He understands to contact the office if the rash worsens.  We will see him in followup in one week. He will contact the office in the interim as outlined above or with any other problems.  Patient seen with Dr. Benay Spice.    Evan Peterson ANP/GNP-BC   02/27/2014  12:41 PM This was a shared visit with Evan Peterson. He would like to begin a trial of salvage chemotherapy with Taxotere. The etiology of the skin rash is unclear. He will try over-the-counter Benadryl and contact us if the rash worsens.  Evan Peterson will return for an office visit in one week.  Evan Peterson, M.D.

## 2014-02-27 NOTE — Telephone Encounter (Signed)
Per staff message and POF I have scheduled appts. Advised scheduler of appts. JMW  

## 2014-02-28 ENCOUNTER — Telehealth: Payer: Self-pay | Admitting: *Deleted

## 2014-02-28 NOTE — Telephone Encounter (Signed)
Called Evan Peterson for chemotherapy F/U.  Patient is doing well.  Experienced nausea, taking anti-emetic and denies vomiting.  Denies any new side effects or symptoms.  Bowel and bladder is functioning well no bm yet today and did not disclose last bm when asked.  Eating and drinking well and I instructed to drink 64 oz minimum daily or at least the day before, of and after treatment.  Denies questions at this time and encouraged to call if needed.  Reviewed how to call after hours in the case of an emergency.

## 2014-02-28 NOTE — Telephone Encounter (Signed)
Message copied by Cherylynn Ridges on Tue Feb 28, 2014  1:25 PM ------      Message from: Renford Dills      Created: Mon Feb 27, 2014  3:14 PM      Regarding: chemo follow-up call       1st Taxotere  Dr. Benay Spice  [phone] ------

## 2014-03-01 ENCOUNTER — Telehealth: Payer: Self-pay | Admitting: Oncology

## 2014-03-01 NOTE — Telephone Encounter (Signed)
called pt via [employer] [phone]) re change in time of 7/20 appt to 11:45am.

## 2014-03-03 ENCOUNTER — Other Ambulatory Visit: Payer: Self-pay | Admitting: *Deleted

## 2014-03-03 DIAGNOSIS — C169 Malignant neoplasm of stomach, unspecified: Secondary | ICD-10-CM

## 2014-03-03 MED ORDER — OXYCODONE HCL 5 MG/5ML PO SOLN: 10.0000 mg | ORAL | Status: DC | PRN

## 2014-03-03 NOTE — Telephone Encounter (Signed)
Wife presents to clinic for refill on oxycodone liquid for patient. She reports patient had called earlier (no documentation of this).

## 2014-03-05 ENCOUNTER — Other Ambulatory Visit: Payer: Self-pay | Admitting: Oncology

## 2014-03-06 ENCOUNTER — Ambulatory Visit: Payer: BC Managed Care – PPO

## 2014-03-06 ENCOUNTER — Other Ambulatory Visit: Payer: Self-pay | Admitting: *Deleted

## 2014-03-06 ENCOUNTER — Other Ambulatory Visit (HOSPITAL_BASED_OUTPATIENT_CLINIC_OR_DEPARTMENT_OTHER): Payer: Medicaid Other

## 2014-03-06 ENCOUNTER — Ambulatory Visit (HOSPITAL_BASED_OUTPATIENT_CLINIC_OR_DEPARTMENT_OTHER): Payer: Medicaid Other | Admitting: Nurse Practitioner

## 2014-03-06 ENCOUNTER — Telehealth: Payer: Self-pay | Admitting: Oncology

## 2014-03-06 ENCOUNTER — Ambulatory Visit (HOSPITAL_BASED_OUTPATIENT_CLINIC_OR_DEPARTMENT_OTHER): Payer: Medicaid Other

## 2014-03-06 VITALS — BP 125/82 | HR 101 | Temp 98.1°F | Resp 18 | Ht 63.0 in | Wt 101.3 lb

## 2014-03-06 DIAGNOSIS — C169 Malignant neoplasm of stomach, unspecified: Secondary | ICD-10-CM

## 2014-03-06 DIAGNOSIS — T7840XA Allergy, unspecified, initial encounter: Secondary | ICD-10-CM

## 2014-03-06 DIAGNOSIS — Z95828 Presence of other vascular implants and grafts: Secondary | ICD-10-CM

## 2014-03-06 DIAGNOSIS — R110 Nausea: Secondary | ICD-10-CM

## 2014-03-06 DIAGNOSIS — Z5111 Encounter for antineoplastic chemotherapy: Secondary | ICD-10-CM

## 2014-03-06 LAB — COMPREHENSIVE METABOLIC PANEL (CC13)
ALBUMIN: 3.2 g/dL — AB (ref 3.5–5.0)
ALK PHOS: 69 U/L (ref 40–150)
ALT: 50 U/L (ref 0–55)
AST: 49 U/L — ABNORMAL HIGH (ref 5–34)
Anion Gap: 8 mEq/L (ref 3–11)
BILIRUBIN TOTAL: 0.21 mg/dL (ref 0.20–1.20)
BUN: 13 mg/dL (ref 7.0–26.0)
CO2: 30 meq/L — AB (ref 22–29)
Calcium: 9 mg/dL (ref 8.4–10.4)
Chloride: 95 mEq/L — ABNORMAL LOW (ref 98–109)
Creatinine: 0.7 mg/dL (ref 0.7–1.3)
Glucose: 79 mg/dl (ref 70–140)
Potassium: 4.2 mEq/L (ref 3.5–5.1)
Sodium: 134 mEq/L — ABNORMAL LOW (ref 136–145)
Total Protein: 7.4 g/dL (ref 6.4–8.3)

## 2014-03-06 LAB — CBC WITH DIFFERENTIAL/PLATELET
BASO%: 0.3 % (ref 0.0–2.0)
BASOS ABS: 0 10*3/uL (ref 0.0–0.1)
EOS ABS: 0.2 10*3/uL (ref 0.0–0.5)
EOS%: 2.1 % (ref 0.0–7.0)
HCT: 38.4 % (ref 38.4–49.9)
HEMOGLOBIN: 13 g/dL (ref 13.0–17.1)
LYMPH%: 18.4 % (ref 14.0–49.0)
MCH: 32.9 pg (ref 27.2–33.4)
MCHC: 33.9 g/dL (ref 32.0–36.0)
MCV: 97.2 fL (ref 79.3–98.0)
MONO#: 0.9 10*3/uL (ref 0.1–0.9)
MONO%: 12 % (ref 0.0–14.0)
NEUT%: 67.2 % (ref 39.0–75.0)
NEUTROS ABS: 5 10*3/uL (ref 1.5–6.5)
NRBC: 0 % (ref 0–0)
Platelets: 457 10*3/uL — ABNORMAL HIGH (ref 140–400)
RBC: 3.95 10*6/uL — AB (ref 4.20–5.82)
RDW: 14.4 % (ref 11.0–14.6)
WBC: 7.5 10*3/uL (ref 4.0–10.3)
lymph#: 1.4 10*3/uL (ref 0.9–3.3)

## 2014-03-06 MED ORDER — SODIUM CHLORIDE 0.9 % IJ SOLN
10.0000 mL | INTRAMUSCULAR | Status: DC | PRN
  Administered 2014-03-06: 10 mL via INTRAVENOUS
  Filled 2014-03-06: qty 10

## 2014-03-06 MED ORDER — SODIUM CHLORIDE 0.9 % IJ SOLN
10.0000 mL | INTRAMUSCULAR | Status: DC | PRN
  Administered 2014-03-06: 10 mL
  Filled 2014-03-06: qty 10

## 2014-03-06 MED ORDER — HEPARIN SOD (PORK) LOCK FLUSH 100 UNIT/ML IV SOLN
500.0000 [IU] | Freq: Once | INTRAVENOUS | Status: AC | PRN
  Administered 2014-03-06: 500 [IU]
  Filled 2014-03-06: qty 5

## 2014-03-06 MED ORDER — ONDANSETRON 8 MG/NS 50 ML IVPB
INTRAVENOUS | Status: AC
  Filled 2014-03-06: qty 8

## 2014-03-06 MED ORDER — LORAZEPAM 2 MG/ML IJ SOLN
INTRAMUSCULAR | Status: AC
  Filled 2014-03-06: qty 1

## 2014-03-06 MED ORDER — DEXTROSE 5 % IV SOLN
25.0000 mg/m2 | Freq: Once | INTRAVENOUS | Status: AC
  Administered 2014-03-06: 40 mg via INTRAVENOUS
  Filled 2014-03-06: qty 4

## 2014-03-06 MED ORDER — LORAZEPAM 2 MG/ML IJ SOLN
0.5000 mg | Freq: Once | INTRAMUSCULAR | Status: AC
  Administered 2014-03-06: 0.5 mg via INTRAVENOUS

## 2014-03-06 MED ORDER — DEXAMETHASONE SODIUM PHOSPHATE 10 MG/ML IJ SOLN
INTRAMUSCULAR | Status: AC
  Filled 2014-03-06: qty 1

## 2014-03-06 MED ORDER — ONDANSETRON 8 MG/50ML IVPB (CHCC)
8.0000 mg | Freq: Once | INTRAVENOUS | Status: AC
  Administered 2014-03-06: 8 mg via INTRAVENOUS

## 2014-03-06 MED ORDER — SODIUM CHLORIDE 0.9 % IV SOLN
Freq: Once | INTRAVENOUS | Status: AC
  Administered 2014-03-06: 13:00:00 via INTRAVENOUS

## 2014-03-06 MED ORDER — DEXAMETHASONE SODIUM PHOSPHATE 10 MG/ML IJ SOLN
10.0000 mg | Freq: Once | INTRAMUSCULAR | Status: AC
  Administered 2014-03-06: 10 mg via INTRAVENOUS

## 2014-03-06 NOTE — Progress Notes (Signed)
[facility] OFFICE PROGRESS NOTE   Diagnosis:  Gastric cancer.  INTERVAL HISTORY:   Evan Peterson returns as scheduled. He completed cycle 1 week 1 Taxotere on 02/27/2014. No change in baseline nausea/vomiting. No mouth sores. No diarrhea. Skin rash is unchanged. He and his wife report that he remains in bed most of the day. Pain control continues to be improved on the higher dose of the Duragesic patch with oxycodone as needed. He continues the G-tube feedings.  Objective:  Vital signs in last 24 hours:  Blood pressure 125/82, pulse 101, temperature 98.1 F (36.7 C), temperature source Oral, resp. rate 18, height '5\' 3"'  (1.6 m), weight 101 lb 4.8 oz (45.949 kg).    HEENT: No thrush or ulcerations. Resp: Lungs clear. Cardio: Regular cardiac rhythm. GI: Abdomen with continued diffuse tenderness to light touch. Left upper quadrant feeding tube intact. Vascular: No leg edema.  Skin: Skin rash over the face, trunk and extremities is better. The rash is drying.   Port-A-Cath site without erythema.  Lab Results:  Lab Results  Component Value Date   WBC 7.5 03/06/2014   HGB 13.0 03/06/2014   HCT 38.4 03/06/2014   MCV 97.2 03/06/2014   PLT 457* 03/06/2014   NEUTROABS 5.0 03/06/2014    Imaging:  No results found.  Medications: I have reviewed the patient's current medications.  Assessment/Plan: 1. Metastatic gastric cancer, stage IV. Status post diagnostic laparoscopy 08/05/2013 confirming carcinomatosis with biopsies of a transverse colon lymph node and small bowel mesentery nodule confirming metastatic poorly differentiated adenocarcinoma.  Lesser curvature gastric mass confirmed on endoscopy 07/12/2013 with a biopsy revealing poorly differentiated adenocarcinoma. HER-2 positive.  Cycle 1 FOLFOX 09/21/2013.  Cycle 2 FOLFOX/initiation of Herceptin 10/12/2013.  Treatment held on 10/27/2013 due to neutropenia.  Treatment held on 11/03/2013 per patient request.  Cycle 3  FOLFOX/cycle 2 Herceptin 11/17/2013.  Cycle 4 FOLFOX/cycle 3 Herceptin 12/15/2013.  CT abdomen/pelvis in the emergency department 12/28/2013. Stable diffuse gastric wall thickening and stable upper abdominal/retroperitoneal nodal metastases. Suspected infection/inflammation along the percutaneous jejunostomy tract in the left mid abdominal wall.  Cycle 5 FOLFOX 12/29/2013. Initiation of Taxotere weekly x3 followed by a one-week break 02/27/2014. 2. Anorexia/weight loss and malnutrition. Maintained on jejunostomy tube feedings.  3. Pain secondary to gastric cancer. Currently on a Duragesic patch 75 mcg every 3 days and oxycodone 10-20 mg every 4 hours as needed. 4. Intermittent nausea and vomiting secondary to gastric cancer.  5. Port-A-Cath placement by Dr. Barry Dienes 09/14/2013. 6. 2-D echo 10/17/2013. Cavity size was normal. Systolic function was normal. Wall motion normal. No regional wall motion abnormalities. 7. Jejunostomy tube site infection 11/03/2013. He completed a course of Septra. The erythema and pain around the tube site resolved. He had recurrent erythema at the J-tube site 12/01/2013 and completed a course of doxycycline. CT 12/28/2013 showed suspected infection/inflammation along the percutaneous jejunostomy tract in the left mid abdominal wall. He completed another course of antibiotics. 8. Jejunostomy tube replaced 12/28/2013 and 01/23/2014. 9. Delayed nausea following cycle 4 FOLFOX. He received Aloxi, Emend and prophylactic Decadron. 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 11. Rash. Improved 03/06/2014.   Disposition: [name] appears unchanged. He continues to have a poor performance status. Plan to proceed with cycle 1 week 2 Taxotere today as scheduled. He will return for the week 3 treatment on 03/13/2014. He will return for a followup visit and cycle 2 Taxotere on 03/24/2014.  Plan reviewed with Dr. Benay Spice.    Ned Card ANP/GNP-BC  03/06/2014  1:05 PM

## 2014-03-06 NOTE — Patient Instructions (Signed)
[facility] Discharge Instructions for Patients Receiving Chemotherapy  Today you received the following chemotherapy agents: taxotere.  To help prevent nausea and vomiting after your treatment, we encourage you to take your nausea medication as prescribed.   If you develop nausea and vomiting that is not controlled by your nausea medication, call the clinic.   BELOW ARE SYMPTOMS THAT SHOULD BE REPORTED IMMEDIATELY:  *FEVER GREATER THAN 100.5 F  *CHILLS WITH OR WITHOUT FEVER  NAUSEA AND VOMITING THAT IS NOT CONTROLLED WITH YOUR NAUSEA MEDICATION  *UNUSUAL SHORTNESS OF BREATH  *UNUSUAL BRUISING OR BLEEDING  TENDERNESS IN MOUTH AND THROAT WITH OR WITHOUT PRESENCE OF ULCERS  *URINARY PROBLEMS  *BOWEL PROBLEMS  UNUSUAL RASH Items with * indicate a potential emergency and should be followed up as soon as possible.  Feel free to call the clinic you have any questions or concerns. The clinic phone number is [phone].

## 2014-03-06 NOTE — Telephone Encounter (Signed)
gv and printed appt sched and avs for July and Aug...sed added tx.

## 2014-03-06 NOTE — Progress Notes (Signed)
Patient had two episodes of vomiting while in infusion. Patient stating he still feels nauseated. Selena Lesser, NP called and at chairside.  Order given and carried out for Ativan  Mg IV push per Selena Lesser, NP

## 2014-03-07 ENCOUNTER — Encounter: Payer: Self-pay | Admitting: Nurse Practitioner

## 2014-03-07 DIAGNOSIS — T7840XA Allergy, unspecified, initial encounter: Secondary | ICD-10-CM | POA: Insufficient documentation

## 2014-03-07 NOTE — Progress Notes (Signed)
[facility]   Chief Complaint  Patient presents with  . Hypersensitivity Reaction    HPI: Evan Peterson 53 y.o. male diagnosed with gastric cancer.  Initiated weekly Taxotere on 02/27/2014.  He presented to the [facility] today for cycle 1, week 2 of his Taxotere.  He developed some nausea and vomited twice while he seething his infusion.   CURRENT THERAPY: Upcoming Treatment Dates - LUNG Docetaxel q7d Days with orders from any treatment category:  03/13/2014      SCHEDULING COMMUNICATION      ondansetron (ZOFRAN) IVPB 8 mg      dexamethasone (DECADRON) injection 10 mg      DOCEtaxel (TAXOTERE) 40 mg in dextrose 5 % 150 mL chemo infusion      sodium chloride 0.9 % injection 10 mL      heparin lock flush 100 unit/mL      heparin lock flush 100 unit/mL      alteplase (CATHFLO ACTIVASE) injection 2 mg      sodium chloride 0.9 % injection 3 mL      Cold Pack 1 packet      diphenhydrAMINE (BENADRYL) injection 25 mg      famotidine (PEPCID) IVPB 20 mg      0.9 %  sodium chloride infusion      methylPREDNISolone sodium succinate (SOLU-MEDROL) 125 mg/2 mL injection 125 mg      EPINEPHrine (ADRENALIN) 0.1 MG/ML injection 0.25 mg      EPINEPHrine (ADRENALIN) 0.1 MG/ML injection 0.25 mg      EPINEPHrine (ADRENALIN) injection 0.5 mg      EPINEPHrine (ADRENALIN) injection 0.5 mg      diphenhydrAMINE (BENADRYL) injection 50 mg      albuterol (PROVENTIL) (2.5 MG/3ML) 0.083% nebulizer solution 2.5 mg      0.9 %  sodium chloride infusion      TREATMENT CONDITIONS 03/20/2014      SCHEDULING COMMUNICATION      ondansetron (ZOFRAN) IVPB 8 mg      dexamethasone (DECADRON) injection 10 mg      DOCEtaxel (TAXOTERE) 40 mg in dextrose 5 % 150 mL chemo infusion      sodium chloride 0.9 % injection 10 mL      heparin lock flush 100 unit/mL      heparin lock flush 100 unit/mL      alteplase (CATHFLO ACTIVASE) injection 2 mg      sodium chloride 0.9 % injection 3 mL      Cold Pack  1 packet      diphenhydrAMINE (BENADRYL) injection 25 mg      famotidine (PEPCID) IVPB 20 mg      0.9 %  sodium chloride infusion      methylPREDNISolone sodium succinate (SOLU-MEDROL) 125 mg/2 mL injection 125 mg      EPINEPHrine (ADRENALIN) 0.1 MG/ML injection 0.25 mg      EPINEPHrine (ADRENALIN) 0.1 MG/ML injection 0.25 mg      EPINEPHrine (ADRENALIN) injection 0.5 mg      EPINEPHrine (ADRENALIN) injection 0.5 mg      diphenhydrAMINE (BENADRYL) injection 50 mg      albuterol (PROVENTIL) (2.5 MG/3ML) 0.083% nebulizer solution 2.5 mg      0.9 %  sodium chloride infusion      TREATMENT CONDITIONS 03/27/2014      SCHEDULING COMMUNICATION      ondansetron (ZOFRAN) IVPB 8 mg      dexamethasone (DECADRON) injection 10 mg  DOCEtaxel (TAXOTERE) 40 mg in dextrose 5 % 150 mL chemo infusion      sodium chloride 0.9 % injection 10 mL      heparin lock flush 100 unit/mL      heparin lock flush 100 unit/mL      alteplase (CATHFLO ACTIVASE) injection 2 mg      sodium chloride 0.9 % injection 3 mL      Cold Pack 1 packet      diphenhydrAMINE (BENADRYL) injection 25 mg      famotidine (PEPCID) IVPB 20 mg      0.9 %  sodium chloride infusion      methylPREDNISolone sodium succinate (SOLU-MEDROL) 125 mg/2 mL injection 125 mg      EPINEPHrine (ADRENALIN) 0.1 MG/ML injection 0.25 mg      EPINEPHrine (ADRENALIN) 0.1 MG/ML injection 0.25 mg      EPINEPHrine (ADRENALIN) injection 0.5 mg      EPINEPHrine (ADRENALIN) injection 0.5 mg      diphenhydrAMINE (BENADRYL) injection 50 mg      albuterol (PROVENTIL) (2.5 MG/3ML) 0.083% nebulizer solution 2.5 mg      0.9 %  sodium chloride infusion      TREATMENT CONDITIONS   Past Medical History  Diagnosis Date  . Cancer     stomach    Past Surgical History  Procedure Laterality Date  . Upper gi endoscopy    . Peg placement N/A 08/05/2013    Procedure:  GASTROSTOMY (PEG) PLACEMENT;  Surgeon: Stark Klein, MD;  Location: [facility];  Service: General;   Laterality: N/A;  . Portacath placement Left 09/14/2013    Procedure: INSERTION PORT-A-CATH;  Surgeon: Stark Klein, MD;  Location: [facility];  Service: General;  Laterality: Left;  . Gastrectomy N/A 08/05/2013    Procedure: Laparoscopy Diagnostic  ;  Surgeon: Stark Klein, MD;  Location: [facility];  Service: General;  Laterality: N/A;    has Gastric cancer, Stage 4, metastatic; Protein-calorie malnutrition, severe; Jejunostomy tube in place; Candidiasis around j tube.; and Hypersensitivity on his problem list.     is allergic to amoxicillin-pot clavulanate; asa; and clarithromycin.    Medication List       This list is accurate as of: 03/06/14 11:59 PM.  Always use your most recent med list.               dimethicone-zinc oxide cream  Apply to g tube site 1-2x/day.     docusate sodium 100 MG capsule  Commonly known as:  COLACE  Take 100 mg by mouth 2 (two) times daily.     feeding supplement (OSMOLITE 1.5 CAL) Liqd  Change Osmolite 1.5 to 100 mL for 14 hours daily (6 cans) with 180 mL free water flush 5 times daily.  Order pump that will dispense 30 mL water q hour while continuous feedings are running.  This is patient's sole source of nutrition.     fentaNYL 75 MCG/HR  Commonly known as:  DURAGESIC - dosed mcg/hr  Place 1 patch (75 mcg total) onto the skin every 3 (three) days.     lidocaine-prilocaine cream  Commonly known as:  EMLA  Apply 1 application topically as needed. Apply to [facility] site 1-2 hours prior to stick and cover with plastic wrap to numb site     LORazepam 0.5 MG tablet  Commonly known as:  ATIVAN  Place 1 tablet (0.5 mg total) under the tongue every 8 (eight) hours as needed for anxiety (or nausea).  ondansetron 4 MG/5ML solution  Commonly known as:  ZOFRAN  Place 5 mLs (4 mg total) into feeding tube every 8 (eight) hours as needed for nausea or vomiting.     oxyCODONE 5 MG/5ML solution  Commonly known as:  ROXICODONE  Take 10-20 mLs (10-20 mg total) by  mouth every 4 (four) hours as needed for severe pain.     polyethylene glycol packet  Commonly known as:  MIRALAX / GLYCOLAX  Take 17 g by mouth daily.          PHYSICAL EXAMINATION  Vitals:  BP 125/82, HR 101, temp 98.1  GENERAL:alert, no distress and anxious EYES: PERRLA, Conjunctiva are pink and non-injected OROPHARYNX:lips, buccal mucosa, and tongue normal   NEURO: alert & oriented x 3 with fluent speech, no focal motor/sensory deficits, gait normal  LABORATORY DATA:. CBC  Lab Results  Component Value Date   WBC 7.5 03/06/2014   RBC 3.95* 03/06/2014   HGB 13.0 03/06/2014   HCT 38.4 03/06/2014   PLT 457* 03/06/2014   MCV 97.2 03/06/2014   MCH 32.9 03/06/2014   MCHC 33.9 03/06/2014   RDW 14.4 03/06/2014   LYMPHSABS 1.4 03/06/2014   MONOABS 0.9 03/06/2014   EOSABS 0.2 03/06/2014   BASOSABS 0.0 03/06/2014     CMET  Lab Results  Component Value Date   NA 134* 03/06/2014   K 4.2 03/06/2014   CL 94* 12/28/2013   CO2 30* 03/06/2014   GLUCOSE 79 03/06/2014   BUN 13.0 03/06/2014   CREATININE 0.7 03/06/2014   CALCIUM 9.0 03/06/2014   PROT 7.4 03/06/2014   ALBUMIN 3.2* 03/06/2014   AST 49* 03/06/2014   ALT 50 03/06/2014   ALKPHOS 69 03/06/2014   BILITOT 0.21 03/06/2014   GFRNONAA >90 12/28/2013   GFRAA >90 12/28/2013   ASSESSMENT/PLAN:    Gastric cancer, Stage 4, metastatic  Assessment & Plan Patient initiated Taxotere chemotherapy on 02/27/2014.  Presented today to receive cycle 1, week 2 of his Taxotere.  He will be due for cycle 1, week 3 of his Taxotere on 03/13/2014.  His next followup visit here at the cancer center will be due on 03/24/2014.   Hypersensitivity  Assessment & Plan Patient was premedicated prior to cycle 1, week 2 of his Taxotere with both dexamethasone 10 mg and Zofran 8 mg IV.  He did experience 2 episodes of vomiting during his infusion.  He was given lorazepam 0.5 mg IV x1; which did relieve his nausea and vomiting.  Patient did confirm that he already  has  anti--nausea medications at home to use on an as-needed basis for nausea.   Patient stated understanding of all instructions; and was in agreement with this plan of care. The patient knows to call the clinic with any problems, questions or concerns.   Review/collaboration with Dr. Benay Spice regarding all aspects of patient's visit today; and mild  hypersensitivity reaction.   Total time spent with patient was 15 minutes;  with greater than 50 percent of that time spent in face to face counseling regarding reviewing patient's symptoms, and monitoring of effectiveness of anti-nausea medications;  and coordination of care and follow up.  Disclaimer: This note was dictated with voice recognition software. Similar sounding words can inadvertently be transcribed and may not be corrected upon review.   Drue Second, NP 03/07/2014

## 2014-03-07 NOTE — Assessment & Plan Note (Signed)
Patient initiated Taxotere chemotherapy on 02/27/2014.  Presented today to receive cycle 1, week 2 of his Taxotere.  He will be due for cycle 1, week 3 of his Taxotere on 03/13/2014.  His next followup visit here at the cancer center will be due on 03/24/2014.

## 2014-03-07 NOTE — Assessment & Plan Note (Signed)
Patient was premedicated prior to cycle 1, week 2 of his Taxotere with both dexamethasone 10 mg and Zofran 8 mg IV.  He did experience 2 episodes of vomiting during his infusion.  He was given lorazepam 0.5 mg IV x1; which did relieve his nausea and vomiting.  Patient did confirm that he already  has anti--nausea medications at home to use on an as-needed basis for nausea.

## 2014-03-10 ENCOUNTER — Other Ambulatory Visit: Payer: Self-pay | Admitting: *Deleted

## 2014-03-10 DIAGNOSIS — C169 Malignant neoplasm of stomach, unspecified: Secondary | ICD-10-CM

## 2014-03-10 MED ORDER — OXYCODONE HCL 5 MG/5ML PO SOLN: 10.0000 mg | ORAL | Status: DC | PRN

## 2014-03-12 ENCOUNTER — Other Ambulatory Visit: Payer: Self-pay | Admitting: Oncology

## 2014-03-13 ENCOUNTER — Other Ambulatory Visit (HOSPITAL_BASED_OUTPATIENT_CLINIC_OR_DEPARTMENT_OTHER): Payer: Medicaid Other

## 2014-03-13 ENCOUNTER — Ambulatory Visit: Payer: Medicaid Other

## 2014-03-13 ENCOUNTER — Other Ambulatory Visit: Payer: Self-pay | Admitting: *Deleted

## 2014-03-13 ENCOUNTER — Ambulatory Visit (HOSPITAL_BASED_OUTPATIENT_CLINIC_OR_DEPARTMENT_OTHER): Payer: Medicaid Other

## 2014-03-13 VITALS — BP 95/74 | HR 97 | Temp 97.8°F

## 2014-03-13 DIAGNOSIS — C772 Secondary and unspecified malignant neoplasm of intra-abdominal lymph nodes: Secondary | ICD-10-CM

## 2014-03-13 DIAGNOSIS — C165 Malignant neoplasm of lesser curvature of stomach, unspecified: Secondary | ICD-10-CM

## 2014-03-13 DIAGNOSIS — Z5111 Encounter for antineoplastic chemotherapy: Secondary | ICD-10-CM

## 2014-03-13 DIAGNOSIS — C169 Malignant neoplasm of stomach, unspecified: Secondary | ICD-10-CM

## 2014-03-13 DIAGNOSIS — Z95828 Presence of other vascular implants and grafts: Secondary | ICD-10-CM

## 2014-03-13 LAB — CBC WITH DIFFERENTIAL/PLATELET
BASO%: 0.7 % (ref 0.0–2.0)
BASOS ABS: 0.1 10*3/uL (ref 0.0–0.1)
EOS ABS: 0.1 10*3/uL (ref 0.0–0.5)
EOS%: 1 % (ref 0.0–7.0)
HCT: 37.9 % — ABNORMAL LOW (ref 38.4–49.9)
HEMOGLOBIN: 12.5 g/dL — AB (ref 13.0–17.1)
LYMPH%: 13.3 % — AB (ref 14.0–49.0)
MCH: 32.9 pg (ref 27.2–33.4)
MCHC: 33 g/dL (ref 32.0–36.0)
MCV: 99.7 fL — AB (ref 79.3–98.0)
MONO#: 1 10*3/uL — ABNORMAL HIGH (ref 0.1–0.9)
MONO%: 12.1 % (ref 0.0–14.0)
NEUT%: 72.9 % (ref 39.0–75.0)
NEUTROS ABS: 6.1 10*3/uL (ref 1.5–6.5)
PLATELETS: 530 10*3/uL — AB (ref 140–400)
RBC: 3.8 10*6/uL — ABNORMAL LOW (ref 4.20–5.82)
RDW: 15 % — AB (ref 11.0–14.6)
WBC: 8.4 10*3/uL (ref 4.0–10.3)
lymph#: 1.1 10*3/uL (ref 0.9–3.3)

## 2014-03-13 LAB — TECHNOLOGIST REVIEW

## 2014-03-13 MED ORDER — DEXAMETHASONE SODIUM PHOSPHATE 10 MG/ML IJ SOLN
10.0000 mg | Freq: Once | INTRAMUSCULAR | Status: AC
  Administered 2014-03-13: 10 mg via INTRAVENOUS

## 2014-03-13 MED ORDER — OXYCODONE-ACETAMINOPHEN 5-325 MG PO TABS
1.0000 | ORAL_TABLET | Freq: Once | ORAL | Status: AC
Start: 2014-03-13 — End: 2014-03-13
  Administered 2014-03-13: 1 via ORAL

## 2014-03-13 MED ORDER — SODIUM CHLORIDE 0.9 % IV SOLN
Freq: Once | INTRAVENOUS | Status: AC
  Administered 2014-03-13: 12:00:00 via INTRAVENOUS

## 2014-03-13 MED ORDER — OXYCODONE-ACETAMINOPHEN 5-325 MG PO TABS
ORAL_TABLET | ORAL | Status: AC
  Filled 2014-03-13: qty 1

## 2014-03-13 MED ORDER — HEPARIN SOD (PORK) LOCK FLUSH 100 UNIT/ML IV SOLN
500.0000 [IU] | Freq: Once | INTRAVENOUS | Status: AC | PRN
  Administered 2014-03-13: 500 [IU]
  Filled 2014-03-13: qty 5

## 2014-03-13 MED ORDER — SODIUM CHLORIDE 0.9 % IJ SOLN
10.0000 mL | INTRAMUSCULAR | Status: DC | PRN
  Administered 2014-03-13: 10 mL
  Filled 2014-03-13: qty 10

## 2014-03-13 MED ORDER — ONDANSETRON 8 MG/NS 50 ML IVPB
INTRAVENOUS | Status: AC
  Filled 2014-03-13: qty 8

## 2014-03-13 MED ORDER — ONDANSETRON 8 MG/50ML IVPB (CHCC)
8.0000 mg | Freq: Once | INTRAVENOUS | Status: AC
  Administered 2014-03-13: 8 mg via INTRAVENOUS

## 2014-03-13 MED ORDER — SODIUM CHLORIDE 0.9 % IJ SOLN
10.0000 mL | INTRAMUSCULAR | Status: DC | PRN
  Administered 2014-03-13: 10 mL via INTRAVENOUS
  Filled 2014-03-13: qty 10

## 2014-03-13 MED ORDER — DEXTROSE 5 % IV SOLN
25.0000 mg/m2 | Freq: Once | INTRAVENOUS | Status: AC
  Administered 2014-03-13: 40 mg via INTRAVENOUS
  Filled 2014-03-13: qty 4

## 2014-03-13 MED ORDER — DEXAMETHASONE SODIUM PHOSPHATE 10 MG/ML IJ SOLN
INTRAMUSCULAR | Status: AC
  Filled 2014-03-13: qty 1

## 2014-03-13 NOTE — Patient Instructions (Signed)
[facility] Discharge Instructions for Patients Receiving Chemotherapy  Today you received the following chemotherapy agents taxotere   To help prevent nausea and vomiting after your treatment, we encourage you to take your nausea medication as directed   If you develop nausea and vomiting that is not controlled by your nausea medication, call the clinic.   BELOW ARE SYMPTOMS THAT SHOULD BE REPORTED IMMEDIATELY:  *FEVER GREATER THAN 100.5 F  *CHILLS WITH OR WITHOUT FEVER  NAUSEA AND VOMITING THAT IS NOT CONTROLLED WITH YOUR NAUSEA MEDICATION  *UNUSUAL SHORTNESS OF BREATH  *UNUSUAL BRUISING OR BLEEDING  TENDERNESS IN MOUTH AND THROAT WITH OR WITHOUT PRESENCE OF ULCERS  *URINARY PROBLEMS  *BOWEL PROBLEMS  UNUSUAL RASH Items with * indicate a potential emergency and should be followed up as soon as possible.  Feel free to call the clinic you have any questions or concerns. The clinic phone number is [phone].  

## 2014-03-13 NOTE — Progress Notes (Signed)
Per Dr Benay Spice ok to treat today using CMET from last 03/06/14.

## 2014-03-13 NOTE — Patient Instructions (Signed)

## 2014-03-17 ENCOUNTER — Other Ambulatory Visit: Payer: Self-pay | Admitting: Nurse Practitioner

## 2014-03-17 DIAGNOSIS — C169 Malignant neoplasm of stomach, unspecified: Secondary | ICD-10-CM

## 2014-03-17 MED ORDER — FENTANYL 75 MCG/HR TD PT72: 75.0000 ug | MEDICATED_PATCH | TRANSDERMAL | Status: DC

## 2014-03-17 MED ORDER — OXYCODONE HCL 5 MG/5ML PO SOLN: 10.0000 mg | ORAL | Status: DC | PRN

## 2014-03-20 ENCOUNTER — Encounter: Payer: Self-pay | Admitting: Oncology

## 2014-03-20 NOTE — Progress Notes (Signed)
Faxed fentanyl pa form to [facility] [employer] °

## 2014-03-22 ENCOUNTER — Ambulatory Visit (HOSPITAL_COMMUNITY)
Admission: RE | Admit: 2014-03-22 | Discharge: 2014-03-22 | Disposition: A | Discharge status: Home / Self Care | Payer: Medicaid Other | Source: Ambulatory Visit | Attending: Interventional Radiology | Admitting: Interventional Radiology

## 2014-03-22 ENCOUNTER — Other Ambulatory Visit (HOSPITAL_COMMUNITY): Payer: Self-pay | Admitting: Interventional Radiology

## 2014-03-22 DIAGNOSIS — Y833 Surgical operation with formation of external stoma as the cause of abnormal reaction of the patient, or of later complication, without mention of misadventure at the time of the procedure: Secondary | ICD-10-CM | POA: Insufficient documentation

## 2014-03-22 DIAGNOSIS — C169 Malignant neoplasm of stomach, unspecified: Secondary | ICD-10-CM

## 2014-03-22 DIAGNOSIS — IMO0002 Reserved for concepts with insufficient information to code with codable children: Secondary | ICD-10-CM | POA: Insufficient documentation

## 2014-03-22 MED ORDER — IOHEXOL 300 MG/ML  SOLN
10.0000 mL | Freq: Once | INTRAMUSCULAR | Status: AC | PRN
  Administered 2014-03-22: 25 mL via INTRAVENOUS

## 2014-03-22 NOTE — Procedures (Signed)
Successful fluoroscopic guided replacement of 18 Fr jejunostomy tube.  No immediate post procedural complications.  The feeding tube is ready for immediate use. 

## 2014-03-24 ENCOUNTER — Encounter (INDEPENDENT_AMBULATORY_CARE_PROVIDER_SITE_OTHER): Payer: Self-pay | Admitting: General Surgery

## 2014-03-24 ENCOUNTER — Ambulatory Visit: Payer: BC Managed Care – PPO

## 2014-03-24 ENCOUNTER — Ambulatory Visit (HOSPITAL_BASED_OUTPATIENT_CLINIC_OR_DEPARTMENT_OTHER): Payer: Medicaid Other | Admitting: Oncology

## 2014-03-24 ENCOUNTER — Ambulatory Visit (HOSPITAL_BASED_OUTPATIENT_CLINIC_OR_DEPARTMENT_OTHER): Payer: Medicaid Other

## 2014-03-24 ENCOUNTER — Telehealth: Payer: Self-pay | Admitting: Oncology

## 2014-03-24 ENCOUNTER — Other Ambulatory Visit (HOSPITAL_BASED_OUTPATIENT_CLINIC_OR_DEPARTMENT_OTHER): Payer: Medicaid Other

## 2014-03-24 VITALS — BP 110/78 | HR 99 | Temp 97.8°F | Resp 18 | Ht 63.0 in | Wt 99.1 lb

## 2014-03-24 DIAGNOSIS — Z95828 Presence of other vascular implants and grafts: Secondary | ICD-10-CM

## 2014-03-24 DIAGNOSIS — C772 Secondary and unspecified malignant neoplasm of intra-abdominal lymph nodes: Secondary | ICD-10-CM

## 2014-03-24 DIAGNOSIS — C165 Malignant neoplasm of lesser curvature of stomach, unspecified: Secondary | ICD-10-CM

## 2014-03-24 DIAGNOSIS — G620 Drug-induced polyneuropathy: Secondary | ICD-10-CM

## 2014-03-24 DIAGNOSIS — C169 Malignant neoplasm of stomach, unspecified: Secondary | ICD-10-CM

## 2014-03-24 DIAGNOSIS — G893 Neoplasm related pain (acute) (chronic): Secondary | ICD-10-CM

## 2014-03-24 DIAGNOSIS — Z452 Encounter for adjustment and management of vascular access device: Secondary | ICD-10-CM

## 2014-03-24 LAB — COMPREHENSIVE METABOLIC PANEL (CC13)
ALK PHOS: 64 U/L (ref 40–150)
ALT: 21 U/L (ref 0–55)
AST: 34 U/L (ref 5–34)
Albumin: 3.1 g/dL — ABNORMAL LOW (ref 3.5–5.0)
Anion Gap: 8 mEq/L (ref 3–11)
BILIRUBIN TOTAL: 0.38 mg/dL (ref 0.20–1.20)
BUN: 9.5 mg/dL (ref 7.0–26.0)
CO2: 30 mEq/L — ABNORMAL HIGH (ref 22–29)
Calcium: 9 mg/dL (ref 8.4–10.4)
Chloride: 95 mEq/L — ABNORMAL LOW (ref 98–109)
Creatinine: 0.7 mg/dL (ref 0.7–1.3)
Glucose: 104 mg/dl (ref 70–140)
Potassium: 4 mEq/L (ref 3.5–5.1)
Sodium: 133 mEq/L — ABNORMAL LOW (ref 136–145)
Total Protein: 7.3 g/dL (ref 6.4–8.3)

## 2014-03-24 LAB — CBC WITH DIFFERENTIAL/PLATELET
BASO%: 0.7 % (ref 0.0–2.0)
BASOS ABS: 0.1 10*3/uL (ref 0.0–0.1)
EOS%: 1.2 % (ref 0.0–7.0)
Eosinophils Absolute: 0.1 10*3/uL (ref 0.0–0.5)
HEMATOCRIT: 38 % — AB (ref 38.4–49.9)
HEMOGLOBIN: 12.4 g/dL — AB (ref 13.0–17.1)
LYMPH%: 14.6 % (ref 14.0–49.0)
MCH: 32.3 pg (ref 27.2–33.4)
MCHC: 32.6 g/dL (ref 32.0–36.0)
MCV: 99.1 fL — AB (ref 79.3–98.0)
MONO#: 1.6 10*3/uL — ABNORMAL HIGH (ref 0.1–0.9)
MONO%: 17.2 % — AB (ref 0.0–14.0)
NEUT#: 6.3 10*3/uL (ref 1.5–6.5)
NEUT%: 66.3 % (ref 39.0–75.0)
Platelets: 584 10*3/uL — ABNORMAL HIGH (ref 140–400)
RBC: 3.84 10*6/uL — ABNORMAL LOW (ref 4.20–5.82)
RDW: 15.3 % — ABNORMAL HIGH (ref 11.0–14.6)
WBC: 9.5 10*3/uL (ref 4.0–10.3)
lymph#: 1.4 10*3/uL (ref 0.9–3.3)

## 2014-03-24 MED ORDER — HEPARIN SOD (PORK) LOCK FLUSH 100 UNIT/ML IV SOLN
500.0000 [IU] | Freq: Once | INTRAVENOUS | Status: AC
  Administered 2014-03-24: 500 [IU] via INTRAVENOUS
  Filled 2014-03-24: qty 5

## 2014-03-24 MED ORDER — SODIUM CHLORIDE 0.9 % IJ SOLN
10.0000 mL | INTRAMUSCULAR | Status: DC | PRN
  Administered 2014-03-24: 10 mL via INTRAVENOUS
  Filled 2014-03-24: qty 10

## 2014-03-24 MED ORDER — LORAZEPAM 0.5 MG PO TABS: 0.5000 mg | ORAL_TABLET | Freq: Three times a day (TID) | ORAL | Status: AC | PRN

## 2014-03-24 MED ORDER — DEXAMETHASONE 4 MG PO TABS: 4.0000 mg | ORAL_TABLET | Freq: Two times a day (BID) | ORAL | Status: AC

## 2014-03-24 NOTE — Progress Notes (Signed)
[facility] OFFICE PROGRESS NOTE   Diagnosis: Gastric cancer  INTERVAL HISTORY:   He has completed 3 treatments with Taxotere. No improvement in nausea or abdominal pain. He reports  constant nausea. He has intermittent emesis. He is unable to take food or liquids by mouth. He is tolerating tube feedings. The feeding tube was replaced yesterday.  Objective:  Vital signs in last 24 hours:  Blood pressure 110/78, pulse 99, temperature 97.8 F (36.6 C), temperature source Oral, resp. rate 18, height _0  (1.6 m), weight 99 lb 1.6 oz (44.951 kg), SpO2 96.00%.    HEENT: No thrush or ulcer Resp: Lungs clear bilaterally Cardio: Regular rate and rhythm GI: Diffuse tenderness, no hepatomegaly, mild erythema surrounding the feeding tube site. No apparent ascites Vascular: No leg edema.   Portacath/PICC-without erythema  Lab Results:  Lab Results  Component Value Date   WBC 9.5 03/24/2014   HGB 12.4* 03/24/2014   HCT 38.0* 03/24/2014   MCV 99.1* 03/24/2014   PLT 584* 03/24/2014   NEUTROABS 6.3 03/24/2014     Imaging:  Ir Replc Duoden/jejuno Tube Percut W/fluoro  03/22/2014   INDICATION: Clogged jejunostomy tube.  EXAM: FLUOROSCOPIC GUIDED REPLACEMENT OF JEJUNOSTOMY TUBE  COMPARISON:  Fluoroscopic guided jejunostomy catheter exchange - 01/23/2014; 12/28/2013  MEDICATIONS: None.  CONTRAST:  20 cc Omnipaque 300  FLUOROSCOPY TIME:  1 minutes 24 seconds  COMPLICATIONS: None.  PROCEDURE: Informed written consent was obtained from the patient after a discussion of the risks, benefits and alternatives to treatment. Questions regarding the procedure were encouraged and answered. A timeout was performed prior to the initiation of the procedure.  The existing jejunostomy tube was injected with a small amount of contrast demonstrated appropriate positioning though abrupt kink was noted within the distal catheter tubing. As such, the decision was made to replace the jejunostomy tube.  The  operative site was prepped and draped in usual sterile fashion. The existing jejunostomy tube was cut and cannulated with a stiff Glidewire which was advanced into the proximal jejunum. Under intermittent fluoroscopic guidance, the existing jejunostomy tube was exchanged for a new 18 French jejunostomy catheter. The retention balloon was inflated and the balloon was cinched. Contrast was injected and several spot fluoroscopic images were obtained in various obliquities confirming intraluminal positioning. The patient tolerated the procedure well without immediate postprocedural complication.  IMPRESSION: Successful fluoroscopic guided placement of a new 18-French jejunostomy tube. The new jejunostomy tube is ready for immediate use.   Electronically Signed   By: Sandi Mariscal M.D.   On: 03/22/2014 16:50    Medications: I have reviewed the patient's current medications.  Assessment/Plan: 1. Metastatic gastric cancer, stage IV. Status post diagnostic laparoscopy 08/05/2013 confirming carcinomatosis with biopsies of a transverse colon lymph node and small bowel mesentery nodule confirming metastatic poorly differentiated adenocarcinoma.  Lesser curvature gastric mass confirmed on endoscopy 07/12/2013 with a biopsy revealing poorly differentiated adenocarcinoma. HER-2 positive.  Cycle 1 FOLFOX 09/21/2013.  Cycle 2 FOLFOX/initiation of Herceptin 10/12/2013.  Treatment held on 10/27/2013 due to neutropenia.  Treatment held on 11/03/2013 per patient request.  Cycle 3 FOLFOX/cycle 2 Herceptin 11/17/2013.  Cycle 4 FOLFOX/cycle 3 Herceptin 12/15/2013.  CT abdomen/pelvis in the emergency department 12/28/2013. Stable diffuse gastric wall thickening and stable upper abdominal/retroperitoneal nodal metastases. Suspected infection/inflammation along the percutaneous jejunostomy tract in the left mid abdominal wall.  Cycle 5 FOLFOX 12/29/2013.  Initiation of Taxotere weekly x3 followed by a one-week break  02/27/2014. 2. Anorexia/weight loss and malnutrition. Maintained  on jejunostomy tube feedings.  3. Pain secondary to gastric cancer. Currently on a Duragesic patch 75 mcg every 3 days and oxycodone 10-20 mg every 4 hours as needed. 4. Intermittent nausea and vomiting secondary to gastric cancer.  5. Port-A-Cath placement by Dr. Barry Dienes 09/14/2013. 6. 2-D echo 10/17/2013. Cavity size was normal. Systolic function was normal. Wall motion normal. No regional wall motion abnormalities. 7. Jejunostomy tube site infection 11/03/2013. He completed a course of Septra. The erythema and pain around the tube site resolved. He had recurrent erythema at the J-tube site 12/01/2013 and completed a course of doxycycline. CT 12/28/2013 showed suspected infection/inflammation along the percutaneous jejunostomy tract in the left mid abdominal wall. He completed another course of antibiotics. 8. Jejunostomy tube replaced 12/28/2013, 01/23/2014, and 03/22/2049. 9. Delayed nausea following cycle 4 FOLFOX. He received Aloxi, Emend and prophylactic Decadron. 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   Disposition:  Mr. Blanton remains asymptomatic with nausea and pain secondary to gastric cancer. He has completed 3 treatments with Taxotere. There has been no improvement in his symptoms. I think it is unlikely he will benefit from further Taxotere. We Decided to discontinue the Taxotere.  He will begin a trial of Decadron for nausea. He will also try Ativan for nausea.  I discussed Hospice care with Mr. Dearinger and his wife be an interpreter. He declines Hospice at present.  He will return for an office visit and further discussion on 04/10/2014. He would like to consider salvage chemotherapy if the nausea improved.    Betsy Coder, MD  03/24/2014  11:50 AM

## 2014-03-24 NOTE — Patient Instructions (Signed)

## 2014-03-24 NOTE — Telephone Encounter (Signed)
pt confirmed labs/ov per 08/07 POF, gave pt AVS...KJ °

## 2014-03-29 ENCOUNTER — Other Ambulatory Visit: Payer: Self-pay | Admitting: *Deleted

## 2014-03-29 DIAGNOSIS — C169 Malignant neoplasm of stomach, unspecified: Secondary | ICD-10-CM

## 2014-03-29 MED ORDER — OXYCODONE HCL 5 MG/5ML PO SOLN: 10.0000 mg | ORAL | Status: DC | PRN

## 2014-03-29 NOTE — Telephone Encounter (Signed)
Pt notified that he can pick up pain rx this afternoon.

## 2014-03-30 ENCOUNTER — Other Ambulatory Visit: Payer: Self-pay | Admitting: *Deleted

## 2014-03-30 DIAGNOSIS — C169 Malignant neoplasm of stomach, unspecified: Secondary | ICD-10-CM

## 2014-03-31 ENCOUNTER — Other Ambulatory Visit: Payer: BC Managed Care – PPO

## 2014-03-31 ENCOUNTER — Ambulatory Visit: Payer: BC Managed Care – PPO

## 2014-04-04 ENCOUNTER — Telehealth: Payer: Self-pay | Admitting: Dietician

## 2014-04-04 NOTE — Telephone Encounter (Signed)
Brief Outpatient Oncology Nutrition Note  Patient has been identified to be at risk on malnutrition screen.  Wt Readings from Last 10 Encounters:  03/24/14 99 lb 1.6 oz (44.951 kg)  03/06/14 101 lb 4.8 oz (45.949 kg)  02/27/14 100 lb 8 oz (45.587 kg)  02/15/14 103 lb 9.6 oz (46.993 kg)  02/01/14 105 lb 3.2 oz (47.718 kg)  01/12/14 110 lb 9.6 oz (50.168 kg)  12/29/13 107 lb 14.4 oz (48.943 kg)  12/12/13 105 lb 9.6 oz (47.9 kg)  12/08/13 103 lb 12.8 oz (47.083 kg)  12/01/13 103 lb 9.6 oz (46.993 kg)    Called patient due to weight loss.  Patient was not available.   Dx:  Gastric Cancer patient of Dr. Benay Spice.    Chart reviewed.  Per MD note 8/7:  Patient with constant nausea with intermittent emesis.  Unable to take food or liquids by mouth.  Tolerating TF via J-tube.  Feeding tube replaced 8/6.  Patient's TF order:  Osmolite 1.5 at 100 ml/hr x 14 hours per day with 180 ml free water flush 5 times daily.  Patient to have pump that will dispense 30 ml water q hour while continuous feeds are running.  This regime to provide: 2130 calories, 89 gm protein, 2376 ml free water daily per J-tube.  Unsure if patient is receiving TF at this goal rate.  Discussed with the [place] RD and message sent with patient details.  RD available as needed.  Antonieta Iba, RD, LDN

## 2014-04-05 ENCOUNTER — Other Ambulatory Visit: Payer: Self-pay | Admitting: *Deleted

## 2014-04-05 DIAGNOSIS — C169 Malignant neoplasm of stomach, unspecified: Secondary | ICD-10-CM

## 2014-04-05 MED ORDER — OXYCODONE HCL 5 MG/5ML PO SOLN: 10.0000 mg | ORAL | Status: AC | PRN

## 2014-04-07 ENCOUNTER — Other Ambulatory Visit: Payer: BC Managed Care – PPO

## 2014-04-07 ENCOUNTER — Ambulatory Visit: Payer: BC Managed Care – PPO

## 2014-04-10 ENCOUNTER — Telehealth: Payer: Self-pay | Admitting: Nurse Practitioner

## 2014-04-10 ENCOUNTER — Ambulatory Visit (HOSPITAL_BASED_OUTPATIENT_CLINIC_OR_DEPARTMENT_OTHER): Payer: Medicaid Other | Admitting: Nurse Practitioner

## 2014-04-10 VITALS — BP 107/73 | HR 10 | Temp 97.7°F | Resp 16 | Ht 63.0 in | Wt 97.6 lb

## 2014-04-10 DIAGNOSIS — G893 Neoplasm related pain (acute) (chronic): Secondary | ICD-10-CM

## 2014-04-10 DIAGNOSIS — C169 Malignant neoplasm of stomach, unspecified: Secondary | ICD-10-CM

## 2014-04-10 DIAGNOSIS — C773 Secondary and unspecified malignant neoplasm of axilla and upper limb lymph nodes: Secondary | ICD-10-CM

## 2014-04-10 DIAGNOSIS — G620 Drug-induced polyneuropathy: Secondary | ICD-10-CM

## 2014-04-10 DIAGNOSIS — C165 Malignant neoplasm of lesser curvature of stomach, unspecified: Secondary | ICD-10-CM

## 2014-04-10 MED ORDER — FENTANYL 75 MCG/HR TD PT72: 75.0000 ug | MEDICATED_PATCH | TRANSDERMAL | Status: AC

## 2014-04-10 NOTE — Progress Notes (Addendum)
[name] OFFICE PROGRESS NOTE   Diagnosis:  Gastric cancer.  INTERVAL HISTORY:   He returns as scheduled. He continues to have nausea/vomiting. He reports abdominal pain is controlled with the Duragesic patch and oxycodone. He spends the majority of each day in bed. He continues G-tube feedings. He is not able to take food or liquids by mouth.  Objective:  Vital signs in last 24 hours:  Blood pressure 107/73, pulse 10, temperature 97.7 F (36.5 C), temperature source Oral, resp. rate 16, height '5\' 3"'  (1.6 m), weight 97 lb 9.6 oz (44.271 kg), SpO2 94.00%.    HEENT: No thrush or ulcers. Resp: Lungs clear bilaterally. Cardio: Regular rate and rhythm. GI: Abdomen is diffusely tender. Feeding tube site is without erythema. Vascular: No leg edema.  Port-A-Cath site without erythema.   Lab Results:  Lab Results  Component Value Date   WBC 9.5 03/24/2014   HGB 12.4* 03/24/2014   HCT 38.0* 03/24/2014   MCV 99.1* 03/24/2014   PLT 584* 03/24/2014   NEUTROABS 6.3 03/24/2014    Imaging:  No results found.  Medications: I have reviewed the patient's current medications.  Assessment/Plan: 1. Metastatic gastric cancer, stage IV. Status post diagnostic laparoscopy 08/05/2013 confirming carcinomatosis with biopsies of a transverse colon lymph node and small bowel mesentery nodule confirming metastatic poorly differentiated adenocarcinoma.  Lesser curvature gastric mass confirmed on endoscopy 07/12/2013 with a biopsy revealing poorly differentiated adenocarcinoma. HER-2 positive.  Cycle 1 FOLFOX 09/21/2013.  Cycle 2 FOLFOX/initiation of Herceptin 10/12/2013.  Treatment held on 10/27/2013 due to neutropenia.  Treatment held on 11/03/2013 per patient request.  Cycle 3 FOLFOX/cycle 2 Herceptin 11/17/2013.  Cycle 4 FOLFOX/cycle 3 Herceptin 12/15/2013.  CT abdomen/pelvis in the emergency department 12/28/2013. Stable diffuse gastric wall thickening and stable upper  abdominal/retroperitoneal nodal metastases. Suspected infection/inflammation along the percutaneous jejunostomy tract in the left mid abdominal wall.  Cycle 5 FOLFOX 12/29/2013.  Initiation of Taxotere weekly x3 followed by a one-week break 02/27/2014. Taxotere discontinued due to no improvement in symptoms. 2. Anorexia/weight loss and malnutrition. Maintained on jejunostomy tube feedings.  3. Pain secondary to gastric cancer. Currently on a Duragesic patch 75 mcg every 3 days and oxycodone 10-20 mg every 4 hours as needed. 4. Intermittent nausea and vomiting secondary to gastric cancer.  5. Port-A-Cath placement by Dr. Barry Dienes 09/14/2013. 6. 2-D echo 10/17/2013. Cavity size was normal. Systolic function was normal. Wall motion normal. No regional wall motion abnormalities. 7. Jejunostomy tube site infection 11/03/2013. He completed a course of Septra. The erythema and pain around the tube site resolved. He had recurrent erythema at the J-tube site 12/01/2013 and completed a course of doxycycline. CT 12/28/2013 showed suspected infection/inflammation along the percutaneous jejunostomy tract in the left mid abdominal wall. He completed another course of antibiotics. 8. Jejunostomy tube replaced 12/28/2013, 01/23/2014, and 03/22/2049. 9. Delayed nausea following cycle 4 FOLFOX. He received Aloxi, Emend and prophylactic Decadron. 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   Disposition: Evan Peterson performance status continues to be poor. He understands this is due to cancer. He is not a candidate for additional systemic therapy in his current condition. We recommended a referral to hospice. He is in agreement. We will contact the [facility] program. He will return for a followup visit in 3 weeks.  Patient seen with Dr. Benay Spice. 25 minutes were spent face-to-face at today's visit with the majority that time involved in counseling/coordination of care.  An interpreter was present throughout today's  visit.  Ned Card ANP/GNP-BC   04/10/2014  2:34 PM  This was a shared this Ned Card. Evan Peterson continues to have abdominal pain and a poor performance status. I do not recommend further chemotherapy. He agrees to a [facility] hospice referral.   Julieanne Manson, M.D.

## 2014-04-10 NOTE — Telephone Encounter (Signed)
Pt confirmed ov per 08/24 POF, gave pt AVS....KJ

## 2014-04-11 ENCOUNTER — Encounter: Payer: Self-pay | Admitting: Oncology

## 2014-04-11 NOTE — Progress Notes (Signed)
Put [employer] disability form on nurse's desk.

## 2014-04-12 ENCOUNTER — Encounter: Payer: Self-pay | Admitting: Oncology

## 2014-04-12 ENCOUNTER — Telehealth: Payer: Self-pay | Admitting: *Deleted

## 2014-04-12 NOTE — Progress Notes (Signed)
Faxed disability form to [employer] @ [phone]

## 2014-04-12 NOTE — Telephone Encounter (Signed)
Pt's Disability Insurance forms returned to [employer] in [place].

## 2014-04-13 ENCOUNTER — Telehealth: Payer: Self-pay | Admitting: *Deleted

## 2014-04-13 NOTE — Telephone Encounter (Signed)
Called to ask if MD has signed the [facility] certificate of medical necessity she faxed to office on 04/10/14. Was faxed to [phone]. Needed asap-call if she needs to send another one

## 2014-05-04 ENCOUNTER — Ambulatory Visit (HOSPITAL_BASED_OUTPATIENT_CLINIC_OR_DEPARTMENT_OTHER): Payer: Medicaid Other | Admitting: Oncology

## 2014-05-04 VITALS — BP 116/73 | HR 111 | Resp 20 | Ht 63.0 in

## 2014-05-04 DIAGNOSIS — R630 Anorexia: Secondary | ICD-10-CM

## 2014-05-04 DIAGNOSIS — R109 Unspecified abdominal pain: Secondary | ICD-10-CM

## 2014-05-04 DIAGNOSIS — C786 Secondary malignant neoplasm of retroperitoneum and peritoneum: Secondary | ICD-10-CM

## 2014-05-04 DIAGNOSIS — C169 Malignant neoplasm of stomach, unspecified: Secondary | ICD-10-CM

## 2014-05-04 DIAGNOSIS — G620 Drug-induced polyneuropathy: Secondary | ICD-10-CM

## 2014-05-04 DIAGNOSIS — R112 Nausea with vomiting, unspecified: Secondary | ICD-10-CM

## 2014-05-04 DIAGNOSIS — G893 Neoplasm related pain (acute) (chronic): Secondary | ICD-10-CM

## 2014-05-04 DIAGNOSIS — R634 Abnormal weight loss: Secondary | ICD-10-CM

## 2014-05-04 MED ORDER — MORPHINE SULFATE (CONCENTRATE) 10 MG /0.5 ML PO SOLN
30.0000 mg | Freq: Once | ORAL | Status: AC
Start: 2014-05-04 — End: 2014-05-04
  Administered 2014-05-04: 30 mg via ORAL
  Filled 2014-05-04: qty 1.5

## 2014-05-04 NOTE — Progress Notes (Signed)
Evan Peterson   Diagnosis: Gastric cancer  INTERVAL HISTORY:   He returns today with his wife and an interpreter. He reports constant nausea and abdominal pain. He also has pain at the upper back. He is not sure whether he is getting any relief from the methadone or Roxanol. He has been taking these medications via the feeding tube. He complains of insomnia.  Objective:  Vital signs in last 24 hours:  Blood pressure 116/73, pulse 111, temperature 0 F (-17.8 C), resp. rate 20, height _0  (1.6 m), weight 0 lb (0 kg).    HEENT: No thrush, mucous membranes are moist Resp: Lungs clear bilaterally Cardio: Regular rate and rhythm GI: Left upper quadrant feeding tube site with mild surrounding erythema and discharge per, the abdomen is firm. No discrete mass. Vascular: No leg edema     Portacath/PICC-without erythema   Medications: I have reviewed the patient's current medications.  Assessment/Plan: 1. Metastatic gastric cancer, stage IV. Status post diagnostic laparoscopy 08/05/2013 confirming carcinomatosis with biopsies of a transverse colon lymph node and small bowel mesentery nodule confirming metastatic poorly differentiated adenocarcinoma.  Lesser curvature gastric mass confirmed on endoscopy 07/12/2013 with a biopsy revealing poorly differentiated adenocarcinoma. HER-2 positive.  Cycle 1 FOLFOX 09/21/2013.  Cycle 2 FOLFOX/initiation of Herceptin 10/12/2013.  Treatment held on 10/27/2013 due to neutropenia.  Treatment held on 11/03/2013 per patient request.  Cycle 3 FOLFOX/cycle 2 Herceptin 11/17/2013.  Cycle 4 FOLFOX/cycle 3 Herceptin 12/15/2013.  CT abdomen/pelvis in the emergency department 12/28/2013. Stable diffuse gastric wall thickening and stable upper abdominal/retroperitoneal nodal metastases. Suspected infection/inflammation along the percutaneous jejunostomy tract in the left mid abdominal wall.  Cycle 5 FOLFOX 12/29/2013.    Initiation of Taxotere weekly x3 followed by a one-week break 02/27/2014.  Taxotere discontinued due to no improvement in symptoms. 2. Anorexia/weight loss and malnutrition. Maintained on jejunostomy tube feedings.  3. Pain secondary to gastric cancer. Currently on a methadone and Roxanol  4. Intermittent nausea and vomiting secondary to gastric cancer.  5. Port-A-Cath placement by Dr. Barry Dienes 09/14/2013. 6. 2-D echo 10/17/2013. Cavity size was normal. Systolic function was normal. Wall motion normal. No regional wall motion abnormalities. 7. Jejunostomy tube site infection 11/03/2013. He completed a course of Septra. The erythema and pain around the tube site resolved. He had recurrent erythema at the J-tube site 12/01/2013 and completed a course of doxycycline. CT 12/28/2013 showed suspected infection/inflammation along the percutaneous jejunostomy tract in the left mid abdominal wall. He completed another course of antibiotics. 8. Jejunostomy tube replaced 12/28/2013, 01/23/2014, and 03/22/2014. 9. Delayed nausea following cycle 4 FOLFOX. He received Aloxi, Emend and prophylactic Decadron. 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    Disposition:  Evan Peterson has metastatic gastric cancer with carcinomatosis. He is symptomatic with nausea and abdominal pain. He vomited several times in the office today. It is unclear whether he is taking the pain medication correctly. He appears uncomfortable.  I discussed the situation with Evan Peterson and his wife. I think he would benefit from a few days at [facility] to get his symptoms under better control. He may benefit from IV narcotics and a different nausea regimen. I contacted [facility] and they have graciously agreed to accept him for admission today.  I will be available to help with his care by telephone over the next few days and I will see him next week if he remains an inpatient.  Betsy Coder, MD  05/04/2014  12:24 PM

## 2014-05-05 ENCOUNTER — Telehealth: Payer: Self-pay | Admitting: Oncology

## 2014-05-05 NOTE — Telephone Encounter (Signed)
s.w. pt and advised on OCT appt....pt ok and aware °

## 2014-05-18 DEATH — deceased

## 2014-05-24 ENCOUNTER — Ambulatory Visit: Payer: Self-pay | Admitting: Nurse Practitioner

## 2015-11-07 IMAGING — CR DG ABDOMEN ACUTE W/ 1V CHEST
3 series · 3 of 3 positions shown · non-contrast
Comparison: PET-CT 08/03/2013 and earlier.

CLINICAL DATA: 52-year-old male with pain and constipation. On
narcotics for cancer pain. Initial encounter.

EXAM:
ACUTE ABDOMEN SERIES (ABDOMEN 2 VIEW & CHEST 1 VIEW)

[t abdomen supine]
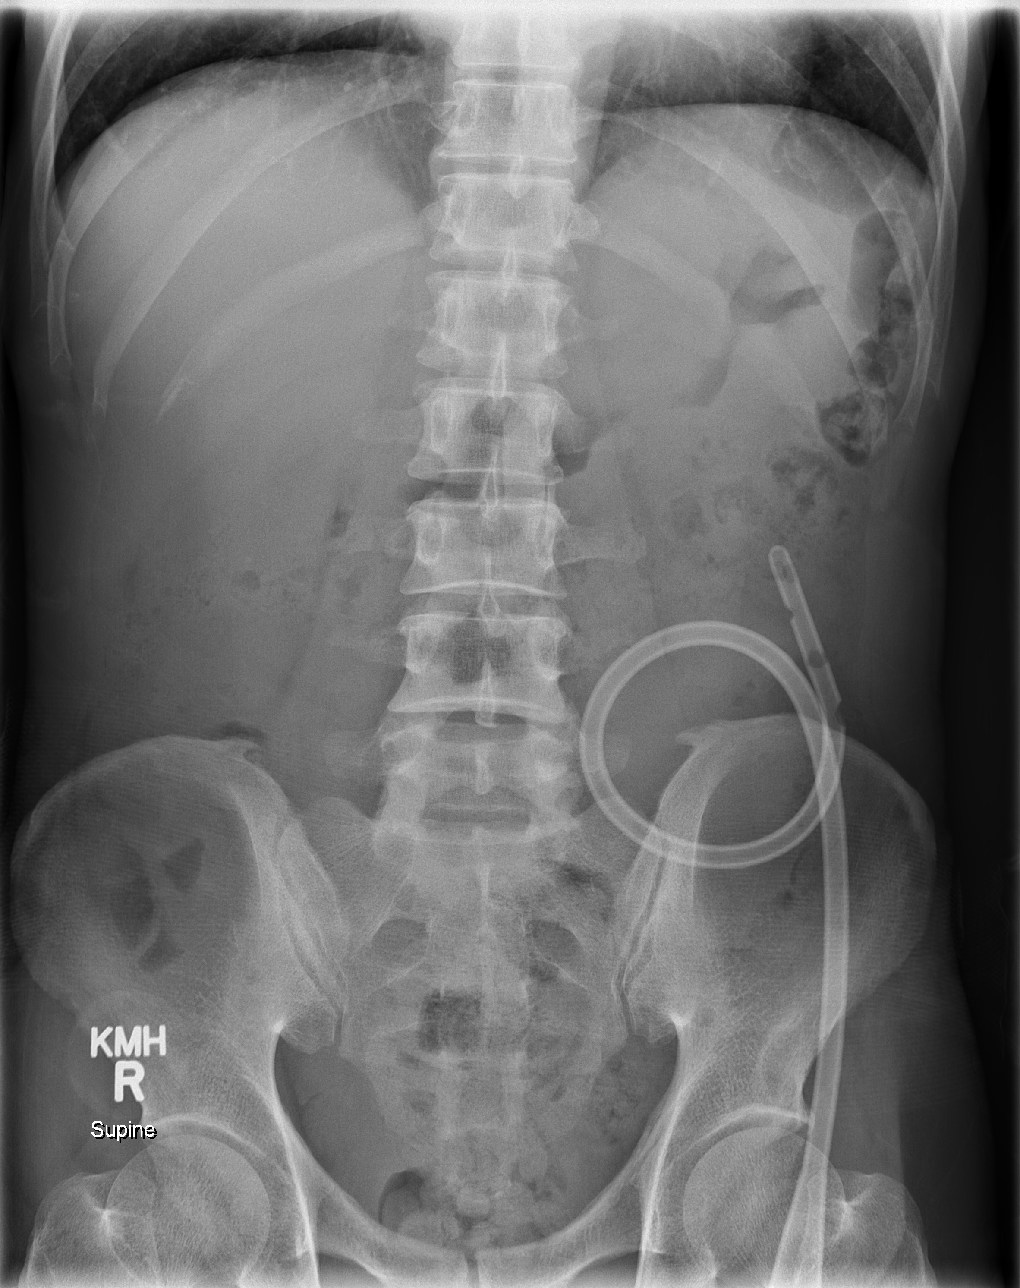

[w chest pa]
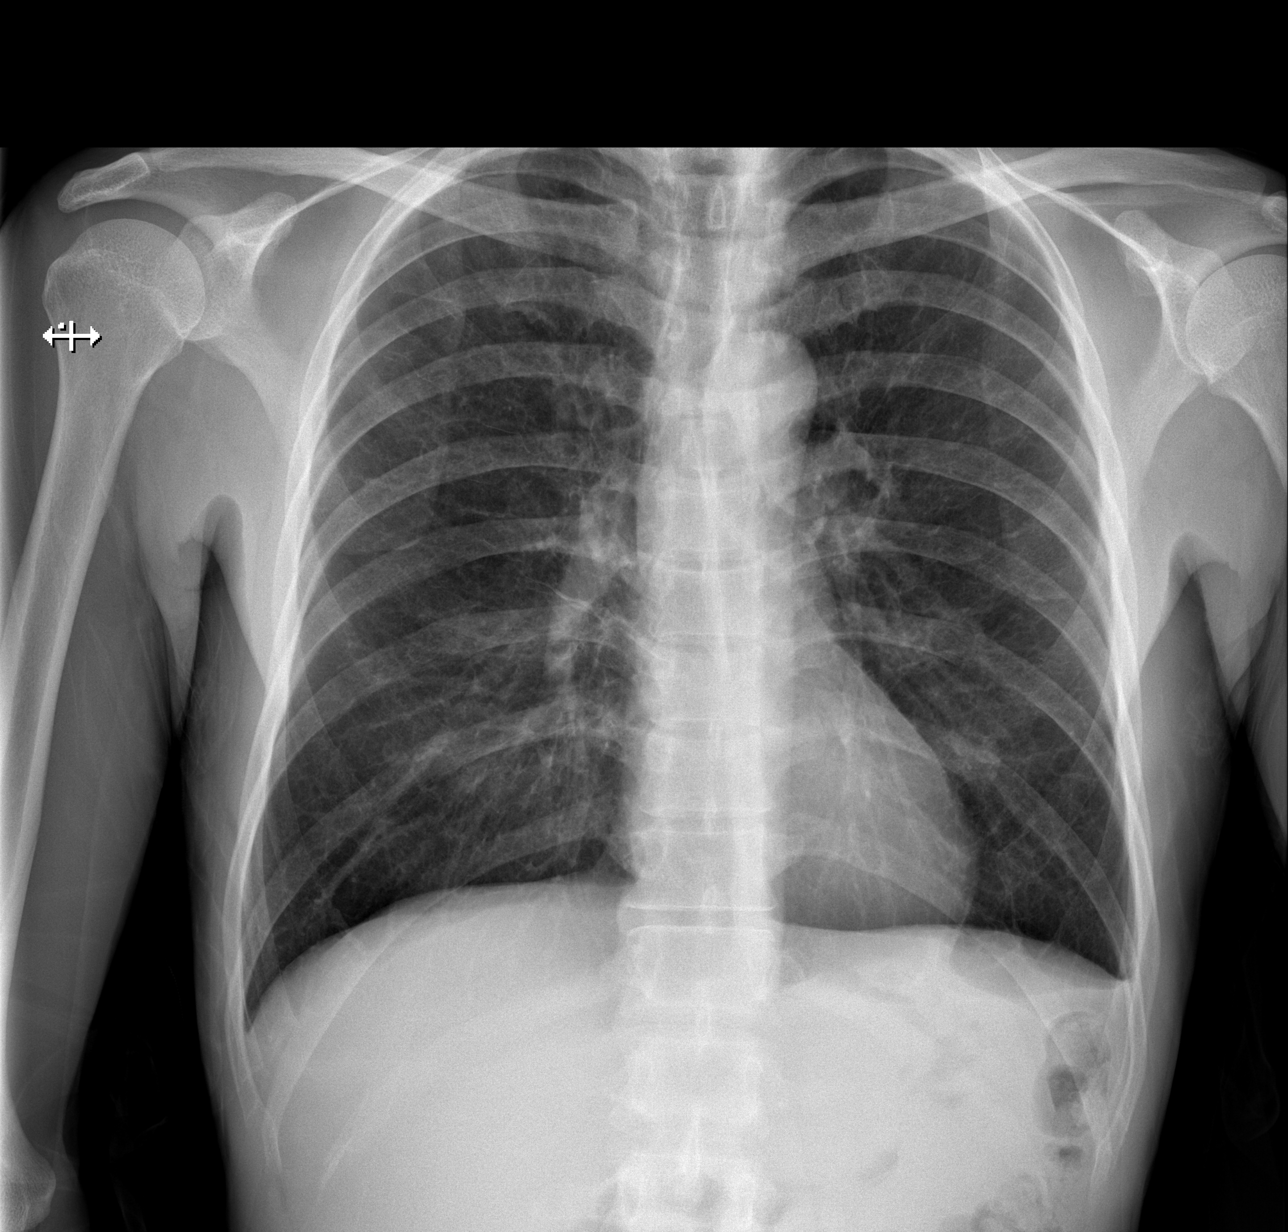

[w abdomen upright]
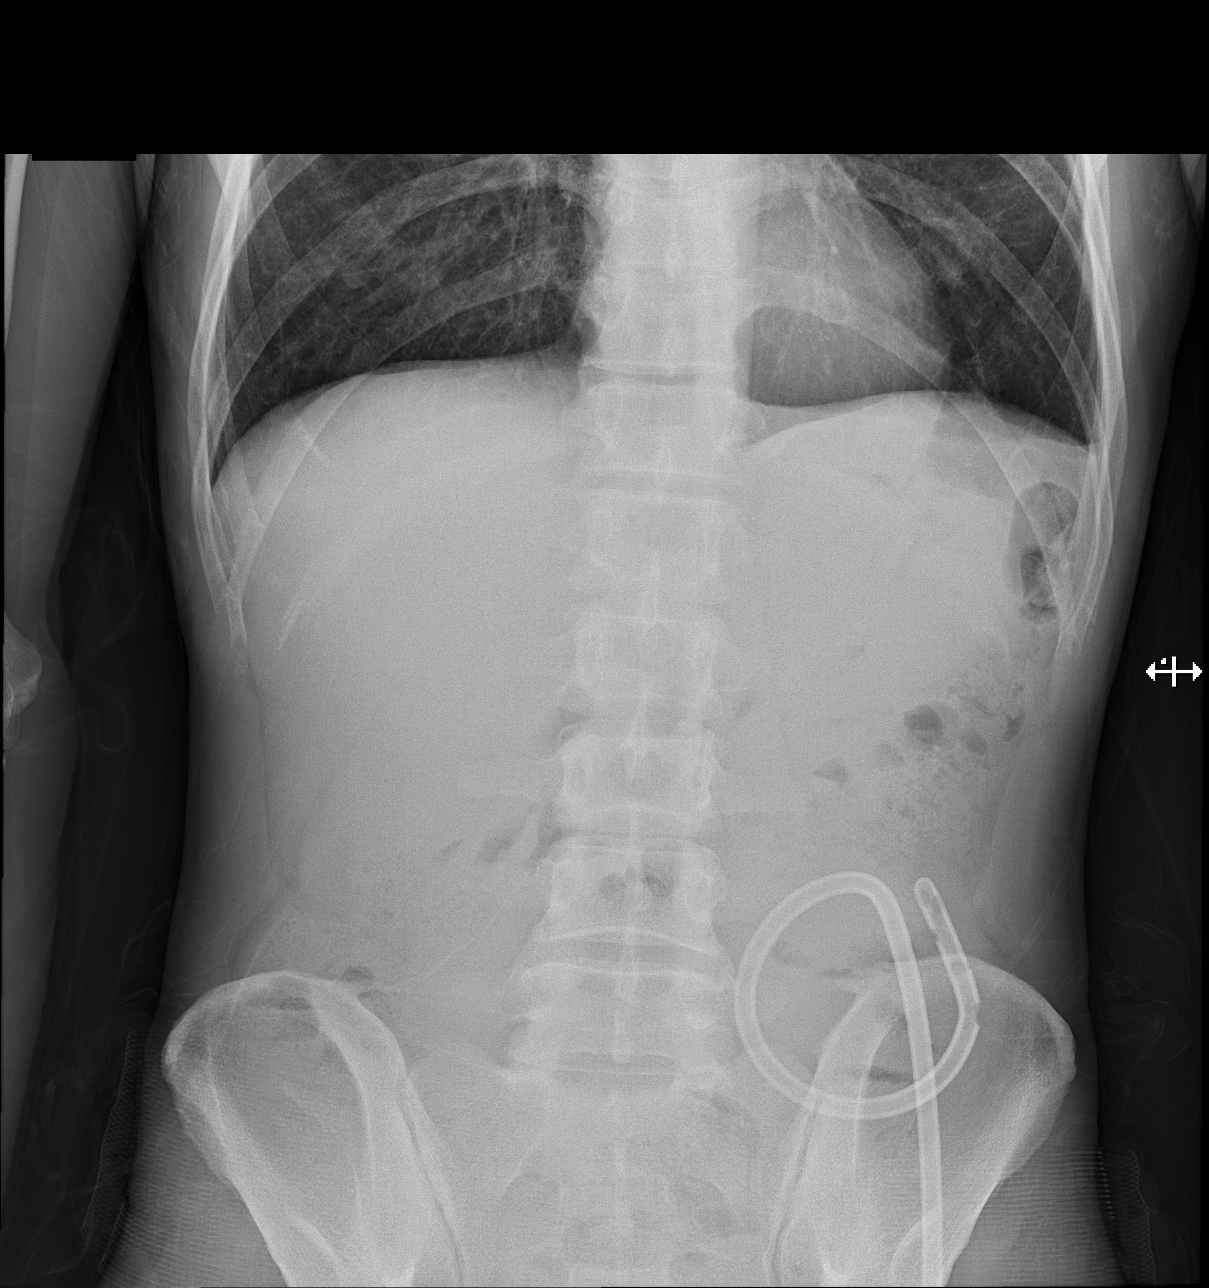

[3 of 3 positions shown; findings below may reference images not displayed]

FINDINGS: Normal cardiac size and mediastinal contours. No pneumothorax or
pneumoperitoneum. No confluent pulmonary opacity.

Left lower quadrant probable small bowel feeding tube. Gas and stool
in the colon. No dilated loops identified. No acute osseous
abnormality identified.
IMPRESSION: 1. Non obstructed bowel gas pattern, no free air. Left lower
quadrant probable percutaneous small bowel feeding tube.
2.  No acute cardiopulmonary abnormality.

## 2016-02-01 ENCOUNTER — Other Ambulatory Visit: Payer: Self-pay | Admitting: Nurse Practitioner

## 2017-09-07 ENCOUNTER — Encounter: Payer: Self-pay | Admitting: Hematology
